# Patient Record
Sex: Male | Born: 1990 | Race: Black or African American | Hispanic: No | Marital: Single | Smoking: Never smoker
Health system: Southern US, Community
[De-identification: ages and names within clinical notes are randomized; demographics above are authoritative.]

## PROBLEM LIST (undated history)

## (undated) DIAGNOSIS — H409 Unspecified glaucoma: Secondary | ICD-10-CM

## (undated) DIAGNOSIS — R002 Palpitations: Secondary | ICD-10-CM

## (undated) DIAGNOSIS — G43909 Migraine, unspecified, not intractable, without status migrainosus: Secondary | ICD-10-CM

## (undated) HISTORY — DX: Migraine, unspecified, not intractable, without status migrainosus: G43.909

## (undated) HISTORY — PX: OTHER SURGICAL HISTORY: SHX169

## (undated) HISTORY — PX: ELBOW FRACTURE SURGERY: SHX616

## (undated) NOTE — Telephone Encounter (Signed)
 Formatting of this note is different from the original. Future Appointments  Date Time Provider Department Center  08/13/2022  4:00 PM [facility] None  07/13/2023  7:30 AM [facility] None  07/20/2023  8:15 AM Perri Lockwood, MD [facility]   Electronically signed by Annabella Charlies Hatchet, RX TECH at 07/31/2022  3:53 PM EDT

---

## 2013-03-15 ENCOUNTER — Emergency Department (HOSPITAL_COMMUNITY)
Admission: EM | Admit: 2013-03-15 | Discharge: 2013-03-15 | Disposition: A | Discharge status: Home / Self Care | Payer: BC Managed Care – PPO | Attending: Emergency Medicine | Admitting: Emergency Medicine

## 2013-03-15 ENCOUNTER — Encounter (HOSPITAL_COMMUNITY): Payer: Self-pay

## 2013-03-15 DIAGNOSIS — R51 Headache: Secondary | ICD-10-CM | POA: Insufficient documentation

## 2013-03-15 DIAGNOSIS — Z79899 Other long term (current) drug therapy: Secondary | ICD-10-CM | POA: Insufficient documentation

## 2013-03-15 DIAGNOSIS — J309 Allergic rhinitis, unspecified: Secondary | ICD-10-CM | POA: Insufficient documentation

## 2013-03-15 DIAGNOSIS — H409 Unspecified glaucoma: Secondary | ICD-10-CM | POA: Insufficient documentation

## 2013-03-15 HISTORY — DX: Unspecified glaucoma: H40.9

## 2013-03-15 MED ORDER — CETIRIZINE-PSEUDOEPHEDRINE ER 5-120 MG PO TB12: 1.0000 | ORAL_TABLET | Freq: Two times a day (BID) | ORAL | Status: DC

## 2013-03-15 MED ORDER — NAPROXEN 500 MG PO TABS
500.0000 mg | ORAL_TABLET | Freq: Once | ORAL | Status: AC
  Administered 2013-03-15: 500 mg via ORAL
  Filled 2013-03-15: qty 1

## 2013-03-15 MED ORDER — NAPROXEN 500 MG PO TABS: 500.0000 mg | ORAL_TABLET | Freq: Two times a day (BID) | ORAL | Status: DC

## 2013-03-15 MED ORDER — GUAIFENESIN ER 600 MG PO TB12: 1200.0000 mg | ORAL_TABLET | Freq: Two times a day (BID) | ORAL | Status: DC

## 2013-03-15 NOTE — ED Provider Notes (Signed)
History     CSN: [id number]  Arrival date & time 03/15/13  2119   First MD Initiated Contact with Patient 03/15/13 2155      Chief Complaint  Patient presents with  . Headache   HPI  History provided by the patient. The patient is a 22 year old male with hx of glaucoma who presents with complaints of throbbing pressure headache to his bilateral forehead. Symptoms began 20-30 minutes prior to arrival. They have been gradually worsening. Pain is described as mild to moderate. It is not the most severe headache he has ever had. Patient states he has had similar headaches more frequently for the past several months. He states he's never really had headaches like this growing up otherwise. He does suffer from seasonal allergies but reports very mild symptoms currently. He has not been taking any daily allergy medications. He denies any vision changes. No blurred vision or eye pain. No dry eyes. No other aggravating or alleviating factors. No other associated symptoms. No fever, chills or sweats. No neck pain or stiffness. No nasal congestion, rhinorrhea or sinus pressure.     Past Medical History  Diagnosis Date  . Glaucoma     History reviewed. No pertinent past surgical history.  History reviewed. No pertinent family history.  History  Substance Use Topics  . Smoking status: Not on file  . Smokeless tobacco: Not on file  . Alcohol Use: No    OB History   Grav Para Term Preterm Abortions TAB SAB Ect Mult Living                  Review of Systems  Constitutional: Negative for fever, chills and diaphoresis.  Eyes: Negative for photophobia.  Gastrointestinal: Negative for nausea and vomiting.  Neurological: Positive for headaches. Negative for dizziness, weakness, light-headedness and numbness.  All other systems reviewed and are negative.    Allergies  Review of patient's allergies indicates no known allergies.  Home Medications   Current Outpatient Rx  Name  Route   Sig  Dispense  Refill  . aspirin-acetaminophen-caffeine (EXCEDRIN MIGRAINE) 250-250-65 MG per tablet   Oral   Take 1 tablet by mouth every 6 (six) hours as needed for pain.         Marland Kitchen latanoprost (XALATAN) 0.005 % ophthalmic solution   Both Eyes   Place 1 drop into both eyes at bedtime.         . cetirizine-pseudoephedrine (ZYRTEC-D) 5-120 MG per tablet   Oral   Take 1 tablet by mouth 2 (two) times daily.   30 tablet   0   . guaiFENesin (MUCINEX) 600 MG 12 hr tablet   Oral   Take 2 tablets (1,200 mg total) by mouth 2 (two) times daily.   60 tablet   0   . naproxen (NAPROSYN) 500 MG tablet   Oral   Take 1 tablet (500 mg total) by mouth 2 (two) times daily.   30 tablet   0     BP 123/61  Pulse 100  Temp(Src) 98.1 F (36.7 C) (Oral)  Resp 22  Ht 5\' 10"  (1.778 m)  Wt 153 lb 8 oz (69.627 kg)  BMI 22.02 kg/m2  SpO2 100%  Physical Exam  Nursing note and vitals reviewed. Constitutional: He is oriented to person, place, and time. He appears well-developed and well-nourished. No distress.  HENT:  Head: Normocephalic and atraumatic.  No sinus pressure or nasal congestion  Eyes: Conjunctivae and EOM are normal. Pupils are equal,  round, and reactive to light.  Neck: Normal range of motion. Neck supple.  No meningeal sign  Cardiovascular: Normal rate and regular rhythm.   Pulmonary/Chest: Effort normal and breath sounds normal. No respiratory distress. He has no wheezes.  Musculoskeletal: Normal range of motion.  Neurological: He is alert and oriented to person, place, and time. He has normal strength. No cranial nerve deficit or sensory deficit. Coordination and gait normal.  Skin: Skin is warm. No rash noted.  Psychiatric: He has a normal mood and affect. His behavior is normal.    ED Course  Procedures     1. Headache       MDM  10:10PM patient seen and evaluated. Patient well-appearing in no acute distress. Normal nonfocal neuro exam. No concerning symptoms  for his headache.        Angus Seller, PA-C 03/16/13 [phone]

## 2013-03-15 NOTE — ED Notes (Signed)
Pt complains of a headache that started about 20 minutes ago, no hx but states that he's been having them lately.

## 2013-03-16 NOTE — ED Provider Notes (Signed)
Medical screening examination/treatment/procedure(s) were performed by non-physician practitioner and as supervising physician I was immediately available for consultation/collaboration.   Charles B. Bernette Mayers, MD 03/16/13 [phone]

## 2013-04-09 ENCOUNTER — Emergency Department (HOSPITAL_BASED_OUTPATIENT_CLINIC_OR_DEPARTMENT_OTHER): Payer: BC Managed Care – PPO

## 2013-04-09 ENCOUNTER — Emergency Department (HOSPITAL_BASED_OUTPATIENT_CLINIC_OR_DEPARTMENT_OTHER)
Admission: EM | Admit: 2013-04-09 | Discharge: 2013-04-09 | Disposition: A | Discharge status: Home / Self Care | Payer: BC Managed Care – PPO | Attending: Emergency Medicine | Admitting: Emergency Medicine

## 2013-04-09 ENCOUNTER — Other Ambulatory Visit: Payer: Self-pay

## 2013-04-09 ENCOUNTER — Encounter (HOSPITAL_BASED_OUTPATIENT_CLINIC_OR_DEPARTMENT_OTHER): Payer: Self-pay

## 2013-04-09 DIAGNOSIS — R002 Palpitations: Secondary | ICD-10-CM | POA: Insufficient documentation

## 2013-04-09 DIAGNOSIS — R197 Diarrhea, unspecified: Secondary | ICD-10-CM | POA: Insufficient documentation

## 2013-04-09 DIAGNOSIS — H409 Unspecified glaucoma: Secondary | ICD-10-CM | POA: Insufficient documentation

## 2013-04-09 DIAGNOSIS — Z79899 Other long term (current) drug therapy: Secondary | ICD-10-CM | POA: Insufficient documentation

## 2013-04-09 DIAGNOSIS — R0602 Shortness of breath: Secondary | ICD-10-CM | POA: Insufficient documentation

## 2013-04-09 LAB — CBC WITH DIFFERENTIAL/PLATELET
Eosinophils Relative: 1 % (ref 0–5)
HCT: 41.6 % (ref 39.0–52.0)
Lymphocytes Relative: 15 % (ref 12–46)
Lymphs Abs: 1.3 10*3/uL (ref 0.7–4.0)
MCV: 81.6 fL (ref 78.0–100.0)
Monocytes Absolute: 0.7 10*3/uL (ref 0.1–1.0)
RBC: 5.1 MIL/uL (ref 4.22–5.81)
RDW: 12.6 % (ref 11.5–15.5)
WBC: 8.3 10*3/uL (ref 4.0–10.5)

## 2013-04-09 LAB — BASIC METABOLIC PANEL
CO2: 28 mEq/L (ref 19–32)
Calcium: 9.5 mg/dL (ref 8.4–10.5)
Creatinine, Ser: 1 mg/dL (ref 0.50–1.35)
Glucose, Bld: 91 mg/dL (ref 70–99)

## 2013-04-09 NOTE — ED Notes (Signed)
Pt states that he feels like he his heart is racing, reports feeling tired, and also c/o sob.  Pt does not appear to be in any distress at time of triage, ambulated to triage with quick gait, in no distress.  Pulse 62bpm.  BP 103 /68

## 2013-04-09 NOTE — ED Provider Notes (Signed)
History     CSN: [id number]  Arrival date & time 04/09/13  1155   First MD Initiated Contact with Patient 04/09/13 1300      Chief Complaint  Patient presents with  . Palpitations    (Consider location/radiation/quality/duration/timing/severity/associated sxs/prior treatment) Patient is a 22 y.o. male presenting with palpitations. The history is provided by the patient. No language interpreter was used.  Palpitations Palpitations quality:  Fast Onset quality:  Sudden Progression:  Resolved Chronicity:  New Context: anxiety   Relieved by:  Nothing Ineffective treatments:  None tried Pt reports he was getting ready for work.   Pt reports he had 2 episodes of diarrhea.  Pt reports he became short of breath and his heart began racing.   (Pt thinks he may have had an anxiety attack)   Pt reports his hear feels normal and his breathing is normal now  Past Medical History  Diagnosis Date  . Glaucoma     Past Surgical History  Procedure Laterality Date  . Elbow fracture surgery      No family history on file.  History  Substance Use Topics  . Smoking status: Never Smoker   . Smokeless tobacco: Never Used  . Alcohol Use: No      Review of Systems  Cardiovascular: Positive for palpitations.  All other systems reviewed and are negative.    Allergies  Review of patient's allergies indicates no known allergies.  Home Medications   Current Outpatient Rx  Name  Route  Sig  Dispense  Refill  . aspirin-acetaminophen-caffeine (EXCEDRIN MIGRAINE) 250-250-65 MG per tablet   Oral   Take 1 tablet by mouth every 6 (six) hours as needed for pain.         . cetirizine-pseudoephedrine (ZYRTEC-D) 5-120 MG per tablet   Oral   Take 1 tablet by mouth 2 (two) times daily.   30 tablet   0   . guaiFENesin (MUCINEX) 600 MG 12 hr tablet   Oral   Take 2 tablets (1,200 mg total) by mouth 2 (two) times daily.   60 tablet   0   . latanoprost (XALATAN) 0.005 % ophthalmic  solution   Both Eyes   Place 1 drop into both eyes at bedtime.         . naproxen (NAPROSYN) 500 MG tablet   Oral   Take 1 tablet (500 mg total) by mouth 2 (two) times daily.   30 tablet   0     BP 103/68  Pulse 62  Temp(Src) 98.8 F (37.1 C) (Oral)  Resp 16  Ht 5\' 10"  (1.778 m)  Wt 150 lb (68.04 kg)  BMI 21.52 kg/m2  SpO2 100%  Physical Exam  Nursing note and vitals reviewed. Constitutional: He is oriented to person, place, and time. He appears well-developed and well-nourished.  HENT:  Head: Normocephalic.  Eyes: Pupils are equal, round, and reactive to light.  Neck: Normal range of motion.  Cardiovascular: Normal rate and regular rhythm.   Pulmonary/Chest: Effort normal and breath sounds normal.  Abdominal: Soft. Bowel sounds are normal.  Neurological: He is alert and oriented to person, place, and time. He has normal reflexes.  Skin: Skin is warm.  Psychiatric: He has a normal mood and affect.    ED Course  Procedures (including critical care time)  Labs Reviewed  CBC WITH DIFFERENTIAL  BASIC METABOLIC PANEL   Dg Chest 2 View  04/09/2013   *RADIOLOGY REPORT*  Clinical Data:  Palpitations and fatigue.  CHEST - 2 VIEW  Comparison: None  Findings: The heart size and mediastinal contours are within normal limits.  Both lungs are clear.  The visualized skeletal structures are unremarkable.  IMPRESSION: No active disease.   Original Report Authenticated By: Irish Lack, M.D.     No diagnosis found.    MDM   Date: 04/09/2013  Rate: 59  Rhythm: sinus bradycardia  QRS Axis: normal  Intervals: normal  ST/T Wave abnormalities: normal  Conduction Disutrbances:none  Narrative Interpretation:   Old EKG Reviewed: none available   Pt monitored  Normal.   Labs normal.    Pt may have viral illness.   Pt given phone numbers for primary care MD       Elson Areas, PA-C 04/09/13 1528

## 2013-04-10 NOTE — ED Provider Notes (Signed)
Medical screening examination/treatment/procedure(s) were performed by non-physician practitioner and as supervising physician I was immediately available for consultation/collaboration.   Joseph Lin. Oletta Lamas, MD 04/10/13 [phone]

## 2013-04-17 ENCOUNTER — Emergency Department (HOSPITAL_BASED_OUTPATIENT_CLINIC_OR_DEPARTMENT_OTHER)
Admission: EM | Admit: 2013-04-17 | Discharge: 2013-04-17 | Disposition: A | Discharge status: Home / Self Care | Payer: BC Managed Care – PPO | Attending: Emergency Medicine | Admitting: Emergency Medicine

## 2013-04-17 ENCOUNTER — Encounter (HOSPITAL_BASED_OUTPATIENT_CLINIC_OR_DEPARTMENT_OTHER): Payer: Self-pay | Admitting: *Deleted

## 2013-04-17 DIAGNOSIS — Z8669 Personal history of other diseases of the nervous system and sense organs: Secondary | ICD-10-CM | POA: Insufficient documentation

## 2013-04-17 DIAGNOSIS — K529 Noninfective gastroenteritis and colitis, unspecified: Secondary | ICD-10-CM

## 2013-04-17 DIAGNOSIS — Z79899 Other long term (current) drug therapy: Secondary | ICD-10-CM | POA: Insufficient documentation

## 2013-04-17 DIAGNOSIS — K5289 Other specified noninfective gastroenteritis and colitis: Secondary | ICD-10-CM | POA: Insufficient documentation

## 2013-04-17 LAB — URINALYSIS, ROUTINE W REFLEX MICROSCOPIC
Glucose, UA: NEGATIVE mg/dL
Hgb urine dipstick: NEGATIVE
Ketones, ur: NEGATIVE mg/dL
Protein, ur: 30 mg/dL — AB

## 2013-04-17 LAB — CBC WITH DIFFERENTIAL/PLATELET
Basophils Absolute: 0 10*3/uL (ref 0.0–0.1)
Basophils Relative: 0 % (ref 0–1)
Eosinophils Relative: 0 % (ref 0–5)
HCT: 45.4 % (ref 39.0–52.0)
Lymphocytes Relative: 5 % — ABNORMAL LOW (ref 12–46)
MCHC: 35.7 g/dL (ref 30.0–36.0)
MCV: 80.9 fL (ref 78.0–100.0)
Monocytes Absolute: 0.5 10*3/uL (ref 0.1–1.0)
RDW: 12.7 % (ref 11.5–15.5)

## 2013-04-17 LAB — URINE MICROSCOPIC-ADD ON

## 2013-04-17 LAB — BASIC METABOLIC PANEL
CO2: 27 mEq/L (ref 19–32)
Calcium: 9.6 mg/dL (ref 8.4–10.5)
Creatinine, Ser: 1 mg/dL (ref 0.50–1.35)

## 2013-04-17 MED ORDER — SODIUM CHLORIDE 0.9 % IV BOLUS (SEPSIS)
1000.0000 mL | Freq: Once | INTRAVENOUS | Status: AC
  Administered 2013-04-17: 1000 mL via INTRAVENOUS

## 2013-04-17 MED ORDER — ONDANSETRON HCL 4 MG/2ML IJ SOLN
4.0000 mg | Freq: Once | INTRAMUSCULAR | Status: AC
  Administered 2013-04-17: 4 mg via INTRAVENOUS
  Filled 2013-04-17: qty 2

## 2013-04-17 MED ORDER — ONDANSETRON HCL 8 MG PO TABS: 8.0000 mg | ORAL_TABLET | Freq: Two times a day (BID) | ORAL | Status: DC | PRN

## 2013-04-17 MED ORDER — KETOROLAC TROMETHAMINE 30 MG/ML IJ SOLN
30.0000 mg | Freq: Once | INTRAMUSCULAR | Status: AC
  Administered 2013-04-17: 30 mg via INTRAVENOUS
  Filled 2013-04-17: qty 1

## 2013-04-17 NOTE — ED Provider Notes (Signed)
History    CSN: [id number] Arrival date & time 04/17/13  0918  First MD Initiated Contact with Patient 04/17/13 [phone]     Chief Complaint  Patient presents with  . Emesis   (Consider location/radiation/quality/duration/timing/severity/associated sxs/prior Treatment) Patient is a 22 y.o. male presenting with vomiting. The history is provided by the patient.  Emesis Severity:  Moderate Duration:  12 hours Timing:  Constant Quality:  Stomach contents Progression:  Worsening Chronicity:  New Recent urination:  Decreased Relieved by:  Nothing Worsened by:  Nothing tried Ineffective treatments:  None tried Associated symptoms: no abdominal pain, no chills, no diarrhea and no fever    Past Medical History  Diagnosis Date  . Glaucoma    Past Surgical History  Procedure Laterality Date  . Elbow fracture surgery     History reviewed. No pertinent family history. History  Substance Use Topics  . Smoking status: Never Smoker   . Smokeless tobacco: Never Used  . Alcohol Use: No    Review of Systems  Constitutional: Negative for chills.  Gastrointestinal: Positive for vomiting. Negative for abdominal pain and diarrhea.  All other systems reviewed and are negative.    Allergies  Review of patient's allergies indicates no known allergies.  Home Medications   Current Outpatient Rx  Name  Route  Sig  Dispense  Refill  . aspirin-acetaminophen-caffeine (EXCEDRIN MIGRAINE) 250-250-65 MG per tablet   Oral   Take 1 tablet by mouth every 6 (six) hours as needed for pain.         . cetirizine-pseudoephedrine (ZYRTEC-D) 5-120 MG per tablet   Oral   Take 1 tablet by mouth 2 (two) times daily.   30 tablet   0   . guaiFENesin (MUCINEX) 600 MG 12 hr tablet   Oral   Take 2 tablets (1,200 mg total) by mouth 2 (two) times daily.   60 tablet   0   . latanoprost (XALATAN) 0.005 % ophthalmic solution   Both Eyes   Place 1 drop into both eyes at bedtime.         . naproxen  (NAPROSYN) 500 MG tablet   Oral   Take 1 tablet (500 mg total) by mouth 2 (two) times daily.   30 tablet   0    BP 113/62  Pulse 90  Temp(Src) 98.3 F (36.8 C) (Oral)  Resp 16  Ht 5\' 10"  (1.778 m)  Wt 149 lb (67.586 kg)  BMI 21.38 kg/m2  SpO2 100% Physical Exam  Nursing note and vitals reviewed. Constitutional: He is oriented to person, place, and time. He appears well-developed and well-nourished. No distress.  HENT:  Head: Normocephalic and atraumatic.  Mouth/Throat: Oropharynx is clear and moist.  Neck: Normal range of motion. Neck supple.  Cardiovascular: Normal rate and regular rhythm.   No murmur heard. Pulmonary/Chest: Effort normal and breath sounds normal. No respiratory distress. He has no wheezes.  Abdominal: Soft. Bowel sounds are normal. He exhibits no distension. There is no tenderness.  Musculoskeletal: Normal range of motion.  Neurological: He is alert and oriented to person, place, and time.  Skin: Skin is warm and dry. He is not diaphoretic.    ED Course  Procedures (including critical care time) Labs Reviewed  URINALYSIS, ROUTINE W REFLEX MICROSCOPIC   No results found. No diagnosis found.  MDM  The presentation, exam, and workup are consistent with a viral gastroenteritis.  He was given ivf, meds, and is feeling better.  Will discharge to home with  zofran, return prn.  Geoffery Lyons, MD 04/17/13 1045

## 2013-04-17 NOTE — ED Notes (Signed)
Pt states he woke up in middle of night and vomited has vomited 3 times total since then as well as 2 episodes of loose stool. No fever or chills

## 2013-06-08 ENCOUNTER — Emergency Department (HOSPITAL_BASED_OUTPATIENT_CLINIC_OR_DEPARTMENT_OTHER)
Admission: EM | Admit: 2013-06-08 | Discharge: 2013-06-08 | Disposition: A | Discharge status: Home / Self Care | Payer: BC Managed Care – PPO | Attending: Emergency Medicine | Admitting: Emergency Medicine

## 2013-06-08 ENCOUNTER — Encounter (HOSPITAL_BASED_OUTPATIENT_CLINIC_OR_DEPARTMENT_OTHER): Payer: Self-pay | Admitting: Emergency Medicine

## 2013-06-08 DIAGNOSIS — Z87828 Personal history of other (healed) physical injury and trauma: Secondary | ICD-10-CM | POA: Insufficient documentation

## 2013-06-08 DIAGNOSIS — Z791 Long term (current) use of non-steroidal anti-inflammatories (NSAID): Secondary | ICD-10-CM | POA: Insufficient documentation

## 2013-06-08 DIAGNOSIS — R220 Localized swelling, mass and lump, head: Secondary | ICD-10-CM | POA: Insufficient documentation

## 2013-06-08 DIAGNOSIS — Z79899 Other long term (current) drug therapy: Secondary | ICD-10-CM | POA: Insufficient documentation

## 2013-06-08 DIAGNOSIS — Z8669 Personal history of other diseases of the nervous system and sense organs: Secondary | ICD-10-CM | POA: Insufficient documentation
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
## 2013-06-08 NOTE — ED Notes (Signed)
MD at bedside. 

## 2013-07-07 ENCOUNTER — Encounter: Payer: Self-pay | Admitting: Neurology

## 2013-07-07 ENCOUNTER — Ambulatory Visit (INDEPENDENT_AMBULATORY_CARE_PROVIDER_SITE_OTHER): Payer: BC Managed Care – PPO | Admitting: Neurology

## 2013-07-07 VITALS — BP 108/68 | HR 61 | Ht 68.0 in | Wt 152.0 lb

## 2013-07-07 DIAGNOSIS — H409 Unspecified glaucoma: Secondary | ICD-10-CM | POA: Insufficient documentation

## 2013-07-07 DIAGNOSIS — G43009 Migraine without aura, not intractable, without status migrainosus: Secondary | ICD-10-CM

## 2013-07-07 NOTE — Patient Instructions (Addendum)
Overall you are doing fairly well but I do want to suggest a few things today:   Remember to drink plenty of fluid, eat healthy meals and do not skip any meals. Try to eat protein with a every meal and eat a healthy snack such as fruit or nuts in between meals. Try to keep a regular sleep-wake schedule and try to exercise daily, particularly in the form of walking, 20-30 minutes a day, if you can.   As far as your medications are concerned, we discussed starting different agents but will hold off as it appears your symptoms are improving  I would like to see you back in 6 months, sooner if we need to. Please call us with any interim questions, concerns, problems, updates or refill requests.    My clinical assistant and will answer any of your questions and relay your messages to me and also relay most of my messages to you.   Our phone number is [phone]. We also have an after hours call service for urgent matters and there is a physician on-call for urgent questions. For any emergencies you know to call 911 or go to the nearest emergency room

## 2013-07-07 NOTE — Progress Notes (Signed)
[facility]  Provider:  Dr Hosie Poisson Referring Provider: No ref. provider found Primary Care Physician:  Pcp Not In System  CC:  headache  HPI:  Joseph Lin is a 22 y.o. male here as a referral from the ED for headache evaluation.   Started having headaches a  Few months ago, getting 4 a week or so. Last hours to all day. Mainly right sided, throbbing, pulsating. No N/V. No photo and phonophobia. Had some numbness on R side of scalp. No vertigo. Some blurry vision with headache. No aura. Has history of migraines in the past, they were similar to this. Takes Excedrin and tylenol for the headache. Takes extra strength tylenol around 5 times a week. Goes in quiet dark room. At worst headaches are a 5/10. Feels headache frequency and severity have been decreasing week or so  Getting 7 to 8 sleep a night. Currently in school, notes having a lot of stress. Limited caffeine intake. No triggers. Random times throughout the days.   Strong family hx of migraines.   Has glaucoma, recently seen eye doctor, told everything is good but scheduled to see an eye specialist today.   Review of Systems: Out of a complete 14 system review, the patient complains of only the following symptoms, and all other reviewed systems are negative. Positive for headache, fatigue  History   Social History  . Marital Status: Single    Spouse Name: N/A    Number of Children: N/A  . Years of Education: N/A   Occupational History  . Not on file.   Social History Main Topics  . Smoking status: Never Smoker   . Smokeless tobacco: Never Used  . Alcohol Use: No  . Drug Use: No  . Sexual Activity: Not on file   Other Topics Concern  . Not on file   Social History Narrative  . No narrative on file    No family history on file.  Past Medical History  Diagnosis Date  . Glaucoma     Past Surgical History  Procedure Laterality Date  . Elbow fracture surgery      Current Outpatient  Prescriptions  Medication Sig Dispense Refill  . aspirin-acetaminophen-caffeine (EXCEDRIN MIGRAINE) 250-250-65 MG per tablet Take 1 tablet by mouth every 6 (six) hours as needed for pain.      . cetirizine-pseudoephedrine (ZYRTEC-D) 5-120 MG per tablet Take 1 tablet by mouth 2 (two) times daily.  30 tablet  0  . guaiFENesin (MUCINEX) 600 MG 12 hr tablet Take 2 tablets (1,200 mg total) by mouth 2 (two) times daily.  60 tablet  0  . latanoprost (XALATAN) 0.005 % ophthalmic solution Place 1 drop into both eyes at bedtime.      . naproxen (NAPROSYN) 500 MG tablet Take 1 tablet (500 mg total) by mouth 2 (two) times daily.  30 tablet  0  . ondansetron (ZOFRAN) 8 MG tablet Take 1 tablet (8 mg total) by mouth every 12 (twelve) hours as needed for nausea.  4 tablet  0   No current facility-administered medications for this visit.    Allergies as of 07/07/2013  . (No Known Allergies)    Vitals: There were no vitals taken for this visit. Last Weight:  Wt Readings from Last 1 Encounters:  04/17/13 149 lb (67.586 kg) (40%*, Z = -0.26)   * Growth percentiles are based on CDC 2-20 Years data.   Last Height:   Ht Readings from Last 1 Encounters:  04/17/13 5\' 10"  (  1.778 m) (55%*, Z = 0.14)   * Growth percentiles are based on CDC 2-20 Years data.     Physical exam: Exam: Gen: NAD, conversant Eyes: anicteric sclerae, moist conjunctivae HENT: Atraumati Neck: Trachea midline; supple,  Lungs: CTA, no wheezing, rales, rhonic                          CV: RRR, no MRG Abdomen: Soft, non-tender;  Extremities: No peripheral edema  Skin: Normal temperature, no rash,  Psych: Appropriate affect, pleasant  Neuro: MS: AA&Ox3, appropriately interactive, normal affect   Speech: fluent w/o paraphasic error  Memory: good recent and remote recall  CN: PERRL, EOMI no nystagmus, funduscopic exam within normal limits, visual fields full to finger count bilaterally, no ptosis, sensation intact to LT V1-V3  bilat, face symmetric, no weakness, hearing grossly intact, palate elevates symmetrically, shoulder shrug 5/5 bilat,  tongue protrudes midline, no fasiculations noted.  Motor: normal bulk and tone Strength: 5/5  In all extremities  Coord: rapid alternating and point-to-point (FNF, HTS) movements intact.  Reflexes: symmetrical, bilat downgoing toes  Sens: LT intact in all extremities  Gait: posture, stance, stride and arm-swing normal. Tandem gait intact. Able to walk on heels and toes. Romberg absent.   Assessment:  After physical and neurologic examination, review of laboratory studies, imaging, neurophysiology testing and pre-existing records, assessment will be reviewed on the problem list.  Plan:  Treatment plan and additional workup will be reviewed under Problem List.  1)Migraine without aura 2)glaucoma  Mr Joseph Lin is a pleasant 22 year old gentleman who presents for initial evaluation of headache. He has a history of migraine headaches and presents with increased frequency of headaches in the past few months. Though he notes that the frequency and severity have been decreasing in the past few weeks. Headaches are typically right-sided, described as pulsating pounding with severity a 5/10 at its worst. He does have a history of glaucoma for which she is followed by the eye physician, reports recently been evaluated and told everything looks good, but will be followed with eye specialist this week. Physical exam is unremarkable. Suspect his headaches are likely migraine without aura. Though would be concerned with his history of glaucoma. He will followup with the eye doctor. We discussed different therapeutic options, as this headache frequency seems to be decreasing will hold off on starting prophylactic medication at this time. He'll continue to use Tylenol on a limited as needed basis. If headache frequency increases will consider starting prophylactic agent. Followup in 6 months

## 2013-09-03 ENCOUNTER — Emergency Department (HOSPITAL_BASED_OUTPATIENT_CLINIC_OR_DEPARTMENT_OTHER)
Admission: EM | Admit: 2013-09-03 | Discharge: 2013-09-03 | Disposition: A | Discharge status: Home / Self Care | Payer: BC Managed Care – PPO | Attending: Emergency Medicine | Admitting: Emergency Medicine

## 2013-09-03 ENCOUNTER — Encounter (HOSPITAL_BASED_OUTPATIENT_CLINIC_OR_DEPARTMENT_OTHER): Payer: Self-pay | Admitting: Emergency Medicine

## 2013-09-03 DIAGNOSIS — I498 Other specified cardiac arrhythmias: Secondary | ICD-10-CM | POA: Insufficient documentation

## 2013-09-03 DIAGNOSIS — R002 Palpitations: Secondary | ICD-10-CM | POA: Insufficient documentation

## 2013-09-03 DIAGNOSIS — R001 Bradycardia, unspecified: Secondary | ICD-10-CM

## 2013-09-03 DIAGNOSIS — G43909 Migraine, unspecified, not intractable, without status migrainosus: Secondary | ICD-10-CM | POA: Insufficient documentation

## 2013-09-03 DIAGNOSIS — IMO0002 Reserved for concepts with insufficient information to code with codable children: Secondary | ICD-10-CM | POA: Insufficient documentation

## 2013-09-03 DIAGNOSIS — Z8669 Personal history of other diseases of the nervous system and sense organs: Secondary | ICD-10-CM | POA: Insufficient documentation

## 2013-09-03 NOTE — ED Provider Notes (Signed)
CSN: [id number]     Arrival date & time 09/03/13  4098 History   First MD Initiated Contact with Patient 09/03/13 0357     Chief Complaint  Patient presents with  . Palpitations   (Consider location/radiation/quality/duration/timing/severity/associated sxs/prior Treatment) HPI This is a 22 year old male who reports episodes of palpitations that began yesterday evening about 5 PM after he took an Excedrin. He has taken Excedrin in the past without incident. He describes the palpitations as feeling like I said in his chest followed by a rapid heartbeat. This has occurred several times since yesterday afternoon and each episode lasts less than a minute. He denies chest pain, shortness of breath or lightheadedness. There are no known mitigating or exacerbating factors.  Past Medical History  Diagnosis Date  . Glaucoma   . Migraine    Past Surgical History  Procedure Laterality Date  . Elbow fracture surgery    .  head surgery     Family History  Problem Relation Age of Onset  . Migraines Mother   . Prostate cancer Father   . Migraines Father   . Diabetes Sister    History  Substance Use Topics  . Smoking status: Never Smoker   . Smokeless tobacco: Never Used  . Alcohol Use: No    Review of Systems  All other systems reviewed and are negative.    Allergies  Review of patient's allergies indicates no known allergies.  Home Medications   Current Outpatient Rx  Name  Route  Sig  Dispense  Refill  . aspirin-acetaminophen-caffeine (EXCEDRIN MIGRAINE) 250-250-65 MG per tablet   Oral   Take 1 tablet by mouth every 6 (six) hours as needed for pain.         Marland Kitchen latanoprost (XALATAN) 0.005 % ophthalmic solution   Both Eyes   Place 1 drop into both eyes at bedtime.         Marland Kitchen PROAIR HFA 108 (90 BASE) MCG/ACT inhaler   Inhalation   Inhale 108 puffs into the lungs as needed.         . predniSONE (STERAPRED UNI-PAK) 10 MG tablet   Oral   Take 10 mg by mouth daily.        . SUMAtriptan (IMITREX) 25 MG tablet   Oral   Take 25 mg by mouth daily as needed.          BP 131/65  Pulse 58  Temp(Src) 98.6 F (37 C) (Oral)  Resp 16  Ht 5\' 8"  (1.727 m)  Wt 150 lb (68.04 kg)  BMI 22.81 kg/m2  SpO2 100%  Physical Exam General: Well-developed, well-nourished male in no acute distress; appearance consistent with age of record HENT: normocephalic; atraumatic; conjunctival injection bilaterally Eyes: pupils equal, round and reactive to light; extraocular muscles intact Neck: supple Heart: Bradycardia with sinus arrhythmia; no murmurs, rubs or gallops Lungs: clear to auscultation bilaterally Abdomen: soft; nondistended; nontender; no masses or hepatosplenomegaly; bowel sounds present Extremities: No deformity; full range of motion; pulses normal; no edema Neurologic: Awake, alert and oriented; motor function intact in all extremities and symmetric; no facial droop Skin: Warm and dry Psychiatric: Flat affect; poor eye contact; poverty of speech    ED Course  Procedures (including critical care time)   EKG Interpretation     Ventricular Rate:  48 PR Interval:  144 QRS Duration: 96 QT Interval:  400 QTC Calculation: 357 R Axis:   90 Text Interpretation:  Sinus bradycardia Borderline  Rightward axis Borderline ECG No  significant change was found except rate is slower      MDM   4:45 AM No ectopy or arrhythmia other than sinus arrhythmia noted during ED stay. The patient's description of his palpitations are suspicious for PVCs or escape beats. Referral to cardiology for further evaluation.  Hanley Seamen, MD 09/03/13 [phone]

## 2013-09-03 NOTE — ED Notes (Signed)
Pt reports intermittent periods of chest palpitations with pressure that began around midnight while he was laying down. States that pressure and palpitations are not present at this time.

## 2013-09-04 ENCOUNTER — Encounter (HOSPITAL_BASED_OUTPATIENT_CLINIC_OR_DEPARTMENT_OTHER): Payer: Self-pay | Admitting: Emergency Medicine

## 2013-09-04 ENCOUNTER — Emergency Department (HOSPITAL_BASED_OUTPATIENT_CLINIC_OR_DEPARTMENT_OTHER)
Admission: EM | Admit: 2013-09-04 | Discharge: 2013-09-04 | Disposition: A | Discharge status: Home / Self Care | Payer: BC Managed Care – PPO | Attending: Emergency Medicine | Admitting: Emergency Medicine

## 2013-09-04 DIAGNOSIS — I951 Orthostatic hypotension: Secondary | ICD-10-CM | POA: Insufficient documentation

## 2013-09-04 DIAGNOSIS — IMO0002 Reserved for concepts with insufficient information to code with codable children: Secondary | ICD-10-CM | POA: Insufficient documentation

## 2013-09-04 DIAGNOSIS — H409 Unspecified glaucoma: Secondary | ICD-10-CM | POA: Insufficient documentation

## 2013-09-04 DIAGNOSIS — Z79899 Other long term (current) drug therapy: Secondary | ICD-10-CM | POA: Insufficient documentation

## 2013-09-04 DIAGNOSIS — R002 Palpitations: Secondary | ICD-10-CM | POA: Insufficient documentation

## 2013-09-04 DIAGNOSIS — R42 Dizziness and giddiness: Secondary | ICD-10-CM | POA: Insufficient documentation

## 2013-09-04 DIAGNOSIS — G43909 Migraine, unspecified, not intractable, without status migrainosus: Secondary | ICD-10-CM | POA: Insufficient documentation

## 2013-09-04 HISTORY — DX: Palpitations: R00.2

## 2013-09-04 LAB — CBC
HCT: 41.9 % (ref 39.0–52.0)
Hemoglobin: 14.3 g/dL (ref 13.0–17.0)
MCH: 28 pg (ref 26.0–34.0)
MCHC: 34.1 g/dL (ref 30.0–36.0)
MCV: 82.2 fL (ref 78.0–100.0)
Platelets: 268 10*3/uL (ref 150–400)
RBC: 5.1 MIL/uL (ref 4.22–5.81)
RDW: 12.7 % (ref 11.5–15.5)
WBC: 6.8 10*3/uL (ref 4.0–10.5)

## 2013-09-04 LAB — BASIC METABOLIC PANEL
BUN: 18 mg/dL (ref 6–23)
CO2: 27 mEq/L (ref 19–32)
Calcium: 9.3 mg/dL (ref 8.4–10.5)
Chloride: 101 mEq/L (ref 96–112)
Creatinine, Ser: 1.1 mg/dL (ref 0.50–1.35)
GFR calc Af Amer: >90 mL/min (ref >90)
GFR calc non Af Amer: >90 mL/min (ref >90)
Glucose, Bld: 92 mg/dL (ref 70–99)
Potassium: 4 mEq/L (ref 3.5–5.1)
Sodium: 138 mEq/L (ref 135–145)

## 2013-09-04 MED ORDER — SODIUM CHLORIDE 0.9 % IV BOLUS (SEPSIS)
1000.0000 mL | INTRAVENOUS | Status: AC
  Administered 2013-09-04: 1000 mL via INTRAVENOUS

## 2013-09-04 NOTE — ED Provider Notes (Signed)
Medical screening examination/treatment/procedure(s) were performed by non-physician practitioner and as supervising physician I was immediately available for consultation/collaboration.  EKG Interpretation   None         Notnamed Scholz, MD 09/04/13 2330 

## 2013-09-04 NOTE — ED Notes (Signed)
Pt reports palpitations throughout today- seen here last night and dx with same- states also feels dizzy

## 2013-09-04 NOTE — ED Notes (Signed)
PA at bedside.

## 2013-09-04 NOTE — ED Provider Notes (Signed)
CSN: [id number]     Arrival date & time 09/04/13  1954 History   First MD Initiated Contact with Patient 09/04/13 2027     Chief Complaint  Patient presents with  . 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  Pt was seen yesterday in ED for same, advised to refrain from caffeine consumption and f/u with Cardiology.  Pt reports he was concerned he had palpitations today w/o consumption of caffeine.  States he was "just standing there" at work when palpitations started associated with some dizziness. Reports palpitations are worse with movement.  Described as heart racing but no chest pain or SOB. Denies diaphoresis, abdominal pain, nausea or vomiting. Denies use of etoh or other drugs. Denies family hx of CAD or sudden death.  Cardiology appointment scheduled for December.  Past Medical History  Diagnosis Date  . Glaucoma   . Migraine   . Heart palpitations    Past Surgical History  Procedure Laterality Date  . Elbow fracture surgery    .  head surgery     Family History  Problem Relation Age of Onset  . Migraines Mother   . Prostate cancer Father   . Migraines Father   . Diabetes Sister    History  Substance Use Topics  . Smoking status: Never Smoker   . Smokeless tobacco: Never Used  . Alcohol Use: No    Review of Systems  Constitutional: Negative for fever, chills, diaphoresis and fatigue.  Respiratory: Negative for cough and shortness of breath.   Cardiovascular: Positive for palpitations. Negative for chest pain and leg swelling.  Gastrointestinal: Negative for nausea and vomiting.  All other systems reviewed and are negative.    Allergies  Review of patient's allergies indicates no known allergies.  Home Medications   Current Outpatient Rx  Name  Route  Sig  Dispense  Refill   . aspirin-acetaminophen-caffeine (EXCEDRIN MIGRAINE) 250-250-65 MG per tablet   Oral   Take 1 tablet by mouth every 6 (six) hours as needed for pain.         Marland Kitchen latanoprost (XALATAN) 0.005 % ophthalmic solution   Both Eyes   Place 1 drop into both eyes at bedtime.         Marland Kitchen PROAIR HFA 108 (90 BASE) MCG/ACT inhaler   Inhalation   Inhale 108 puffs into the lungs as needed.         . SUMAtriptan (IMITREX) 25 MG tablet   Oral   Take 25 mg by mouth daily as needed.         . predniSONE (STERAPRED UNI-PAK) 10 MG tablet   Oral   Take 10 mg by mouth daily.          BP 113/72  Pulse 74  Temp(Src) 97.8 F (36.6 C) (Oral)  Resp 16  Ht 5\' 8"  (1.727 m)  Wt 150 lb (68.04 kg)  BMI 22.81 kg/m2  SpO2 100% Physical Exam  Nursing note and vitals reviewed. Constitutional: He appears well-developed and well-nourished.  Pt lying comfortably in exam bed, NAD.   HENT:  Head: Normocephalic and atraumatic.  Eyes: Conjunctivae are normal. No scleral icterus.  Neck: Normal range of motion.  Cardiovascular: Normal rate, regular rhythm and normal heart sounds.   Pulmonary/Chest: Effort normal and breath sounds normal. No respiratory distress. He has no wheezes. He has no rales. He exhibits no  tenderness.  No respiratory distress, able to speak in full sentences w/o difficulty. Lungs: CTAB. No chest wall tenderness.  Abdominal: Soft. Bowel sounds are normal. He exhibits no distension and no mass. There is no tenderness. There is no rebound and no guarding.  Musculoskeletal: Normal range of motion.  Neurological: He is alert.  Skin: Skin is warm and dry.    ED Course  Procedures (including critical care time) Labs Review Labs Reviewed  CBC  BASIC METABOLIC PANEL   Imaging Review No results found.  EKG Interpretation   None       MDM   1. Palpitations   2. Orthostatic hypotension    Pt c/o palpitations. Was seen yesterday for same. Denies chest pain or SOB. Denies use of  caffeine today when palpitations started.  EKG: unremarkable. Not concerning for ACS.  Vitals: unremarkable. Will get orthostatic vital signs.  If normal will discharge home.   Pt was found to have orthostatic hypotension.  1L fluids given in ED and CBC and BMP drawn.  Labs: unremarkable. Orthostatic vitals and pt's symptoms improved after fluids given.  Strongly advised pt to drink plenty of water per day and eat at least 3 full meals per day.  Also advised to get 8hrs of sleep.  F/u with cardiologist as scheduled in December. Return precautions provided. Pt verbalized understanding and agreement with tx plan.   Junius Finner, PA-C 09/04/13 2307

## 2013-09-06 ENCOUNTER — Encounter: Payer: Self-pay | Admitting: Cardiology

## 2013-09-06 ENCOUNTER — Ambulatory Visit (INDEPENDENT_AMBULATORY_CARE_PROVIDER_SITE_OTHER): Payer: BC Managed Care – PPO | Admitting: Cardiology

## 2013-09-06 VITALS — BP 110/74 | HR 53 | Ht 68.0 in | Wt 151.9 lb

## 2013-09-06 DIAGNOSIS — I498 Other specified cardiac arrhythmias: Secondary | ICD-10-CM

## 2013-09-06 DIAGNOSIS — R002 Palpitations: Secondary | ICD-10-CM

## 2013-09-06 DIAGNOSIS — R001 Bradycardia, unspecified: Secondary | ICD-10-CM

## 2013-09-06 NOTE — Progress Notes (Signed)
HPI The patient has no prior cardiac history. He has been in the emergency room a few times this year and I have reviewed these records. Most recently earlier this month he was having palpitations. He said this started a few weeks ago. He says if he is doing something such as lifting boxes his heart start to go fast. [place] had panic attacks.  He has never been treated for this. He does report increased stress and anxiety since June. He says when his symptoms started he feels his heart beating faster. He doesn't think that it happens suddenly. It might be a more gradual onset. It seems to be regular though he has some rare skipping beats. It lasts for about 5 minutes. He may have some sharp chest discomfort under his left chest but no neck or arm discomfort. He does get slightly short of breath and hot. He hasn't had diaphoresis. He hasn't had presyncope or syncope. It slowly goes away on its own. He has not had one he's been resting. He denies any new shortness of breath, PND or orthopnea. He does report that his job is very physical. He is also apparently going to school.  No Known Allergies  Current Outpatient Prescriptions  Medication Sig Dispense Refill  . aspirin-acetaminophen-caffeine (EXCEDRIN MIGRAINE) 250-250-65 MG per tablet Take 1 tablet by mouth every 6 (six) hours as needed for pain.      Marland Kitchen latanoprost (XALATAN) 0.005 % ophthalmic solution Place 1 drop into both eyes at bedtime.      Marland Kitchen PROAIR HFA 108 (90 BASE) MCG/ACT inhaler Inhale 108 puffs into the lungs as needed.       No current facility-administered medications for this visit.    Past Medical History  Diagnosis Date  . Glaucoma   . Migraine   . Heart palpitations     Past Surgical History  Procedure Laterality Date  . Elbow fracture surgery    .  head surgery      Family History  Problem Relation Age of Onset  . Migraines Mother   .  Prostate cancer Father   . Migraines Father   . Diabetes Sister     History   Social History  . Marital Status: Single    Spouse Name: N/A    Number of Children: 0  . Years of Education: college   Occupational History  . [employer]    Social History Main Topics  . Smoking status: Never Smoker   . Smokeless tobacco: Never Used  . Alcohol Use: No  . Drug Use: No  . Sexual Activity: Not on file   Other Topics Concern  . Not on file   Social History Narrative  . No narrative on file    ROS:  Positive for occasional dizziness, tinnitus, urinary frequency, seasonal allergies. Otherwise as stated in the HPI and negative for all other systems.   PHYSICAL EXAM BP 110/74  Pulse 53  Ht 5\' 8"  (1.727 m)  Wt 151 lb 14.4 oz (68.901 kg)  BMI 23.10 kg/m2 GENERAL:  Well appearing HEENT:  Pupils equal round and reactive, fundi not visualized, oral mucosa unremarkable NECK:  No jugular venous distention, waveform within normal limits, carotid upstroke brisk and symmetric, no bruits, no thyromegaly LYMPHATICS:  No cervical, inguinal adenopathy LUNGS:  Clear to auscultation bilaterally BACK:  No CVA tenderness CHEST:  Unremarkable HEART:  PMI not displaced or sustained,S1  and S2 within normal limits, no S3, no S4, no clicks, no rubs, no murmurs ABD:  Flat, positive bowel sounds normal in frequency in pitch, no bruits, no rebound, no guarding, no midline pulsatile mass, no hepatomegaly, no splenomegaly EXT:  2 plus pulses throughout, no edema, no cyanosis no clubbing SKIN:  No rashes no nodules NEURO:  Cranial nerves II through XII grossly intact, motor grossly intact throughout PSYCH:  Cognitively intact, oriented to person place and time   EKG:  Sinus bradycardia, rate 53, axis within normal limits, sinus arrhythmia, intervals within normal limits, no acute ST-T wave changes. 09/06/2013   ASSESSMENT AND PLAN  PALPITATIONS:  I suspect this might be panic or anxiety. However, I need to  exclude a primary dysrhythmia. I will place a 48 hour Holter monitor. Of note he reports recent lab work to include a thyroid. I did review the ER notes and other labwork which was normal.

## 2013-09-06 NOTE — Patient Instructions (Signed)
Continue current medications as listed.  Your physician has recommended that you wear a holter monitor for 48 hours. Holter monitors are medical devices that record the heart's electrical activity. Doctors most often use these monitors to diagnose arrhythmias. Arrhythmias are problems with the speed or rhythm of the heartbeat. The monitor is a small, portable device. You can wear one while you do your normal daily activities. This is usually used to diagnose what is causing palpitations/syncope (passing out).  You will be called with your results.  Follow up with Dr Antoine Poche as needed.

## 2013-09-08 ENCOUNTER — Encounter: Payer: BC Managed Care – PPO | Admitting: *Deleted

## 2013-09-14 ENCOUNTER — Encounter: Payer: Self-pay | Admitting: Cardiology

## 2013-10-10 ENCOUNTER — Encounter: Payer: Self-pay | Admitting: *Deleted

## 2013-10-24 IMAGING — CR DG CHEST 2V
2 series · 2 of 2 positions shown · non-contrast
Comparison: None

CLINICAL DATA: Palpitations and fatigue.

CHEST - 2 VIEW

[w chest pa]
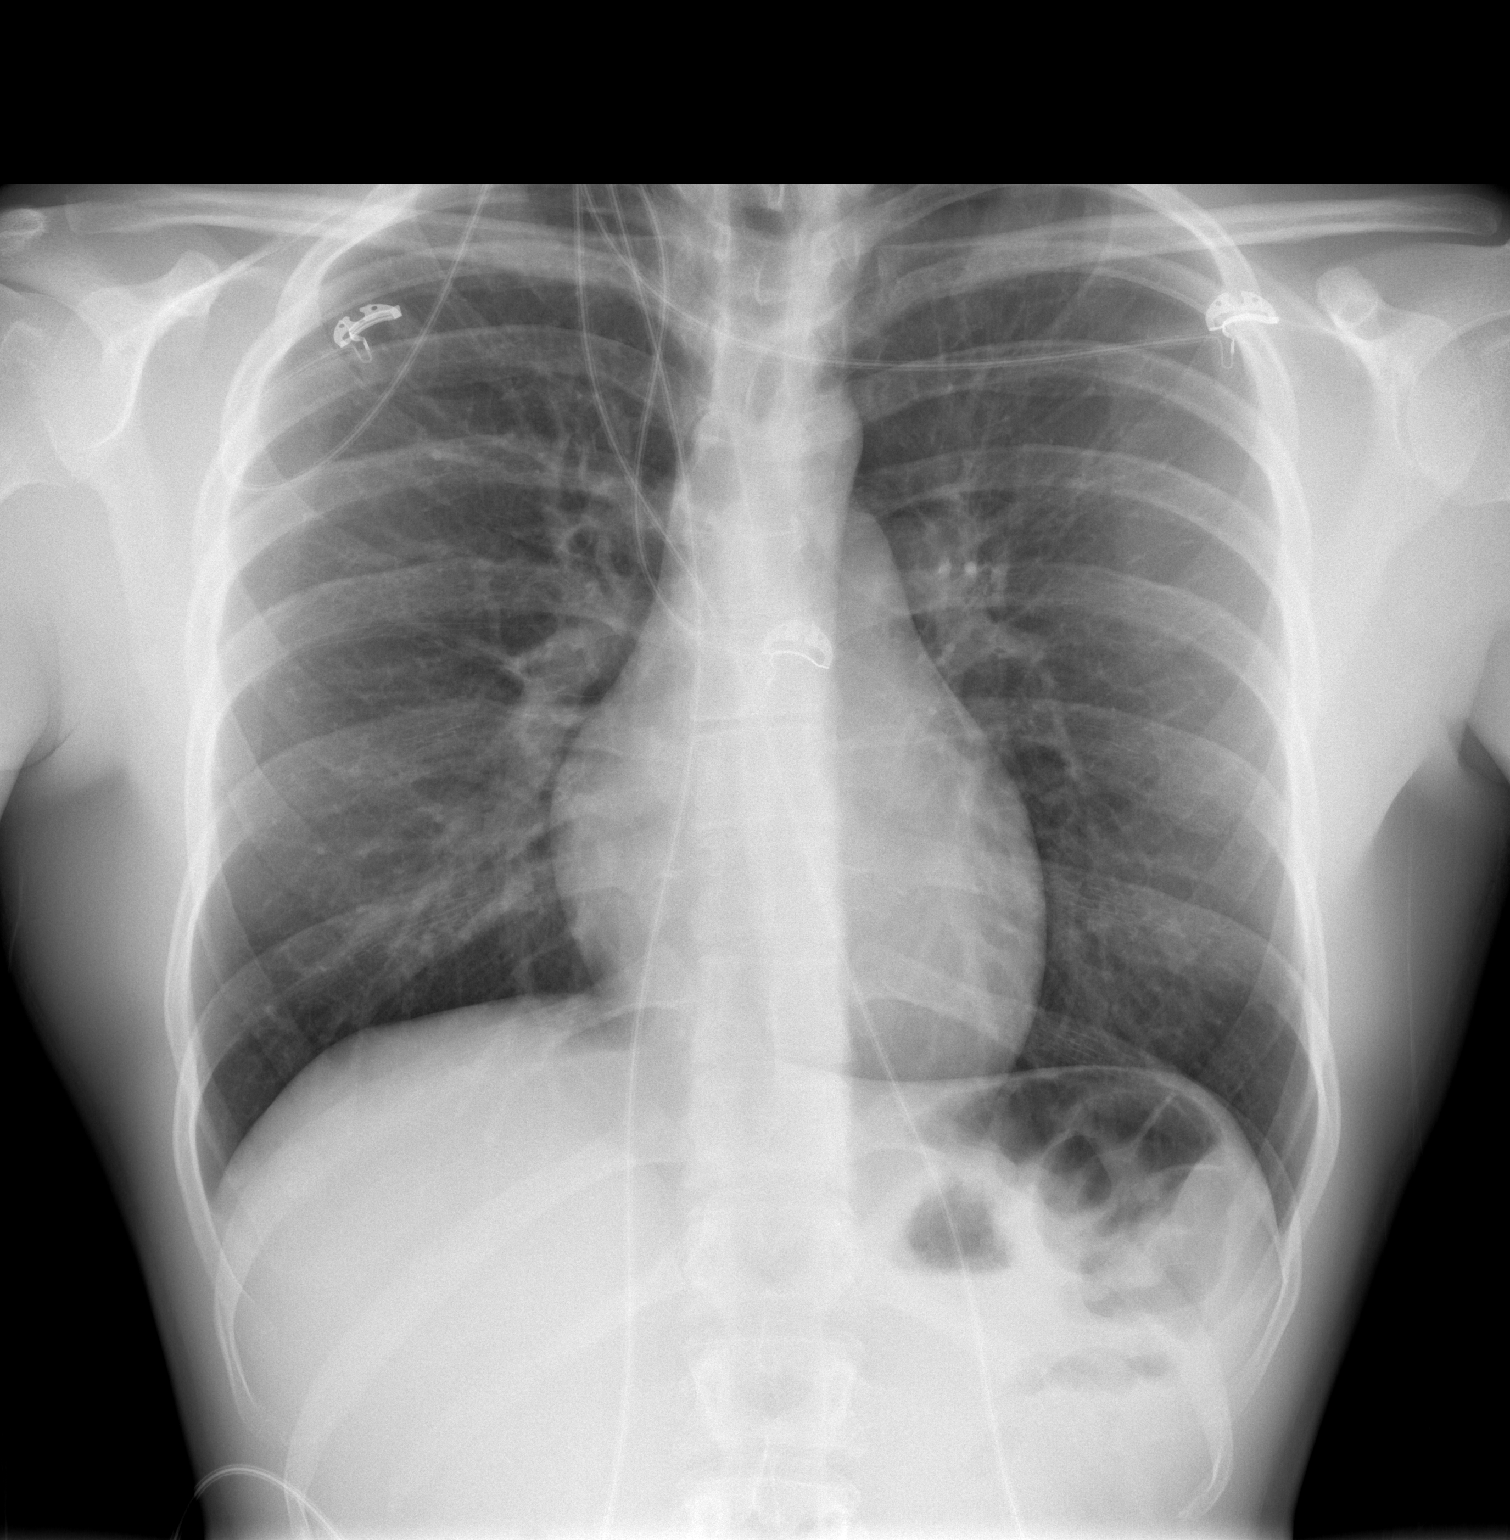

[w chest lat]
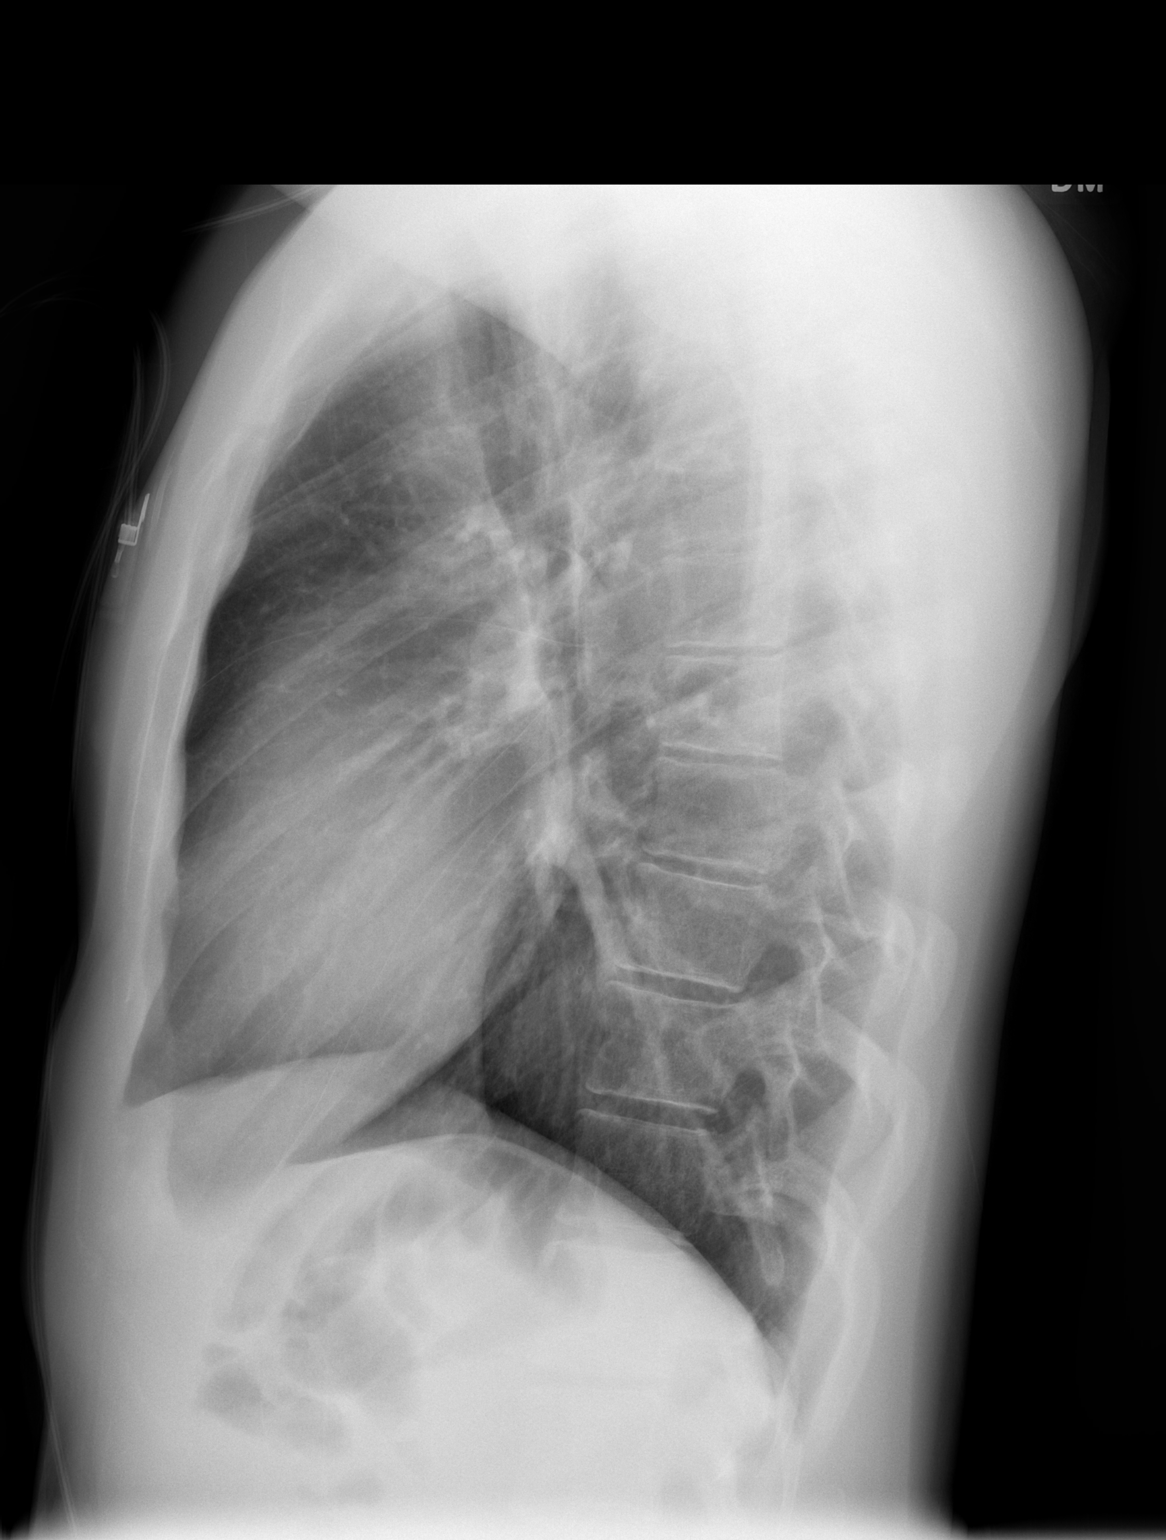

[2 of 2 positions shown; findings below may reference images not displayed]

FINDINGS: The heart size and mediastinal contours are within normal
limits.  Both lungs are clear.  The visualized skeletal structures
are unremarkable.
IMPRESSION: No active disease.

## 2013-11-09 ENCOUNTER — Encounter (HOSPITAL_COMMUNITY): Payer: Self-pay | Admitting: Emergency Medicine

## 2013-11-09 ENCOUNTER — Emergency Department (HOSPITAL_COMMUNITY)
Admission: EM | Admit: 2013-11-09 | Discharge: 2013-11-09 | Disposition: A | Discharge status: Home / Self Care | Payer: BC Managed Care – PPO | Attending: Emergency Medicine | Admitting: Emergency Medicine

## 2013-11-09 DIAGNOSIS — R519 Headache, unspecified: Secondary | ICD-10-CM

## 2013-11-09 DIAGNOSIS — R51 Headache: Secondary | ICD-10-CM | POA: Insufficient documentation

## 2013-11-09 DIAGNOSIS — Z8679 Personal history of other diseases of the circulatory system: Secondary | ICD-10-CM | POA: Insufficient documentation

## 2013-11-09 DIAGNOSIS — H409 Unspecified glaucoma: Secondary | ICD-10-CM | POA: Insufficient documentation

## 2013-11-09 DIAGNOSIS — G43909 Migraine, unspecified, not intractable, without status migrainosus: Secondary | ICD-10-CM

## 2013-11-09 MED ORDER — PROCHLORPERAZINE EDISYLATE 5 MG/ML IJ SOLN
10.0000 mg | Freq: Once | INTRAMUSCULAR | Status: AC
  Administered 2013-11-09: 10 mg via INTRAVENOUS
  Filled 2013-11-09: qty 2

## 2013-11-09 MED ORDER — PROCHLORPERAZINE EDISYLATE 5 MG/ML IJ SOLN: 10.0000 mg | Freq: Four times a day (QID) | INTRAMUSCULAR | Status: DC | PRN

## 2013-11-09 MED ORDER — SODIUM CHLORIDE 0.9 % IV BOLUS (SEPSIS)
1000.0000 mL | Freq: Once | INTRAVENOUS | Status: AC
  Administered 2013-11-09: 1000 mL via INTRAVENOUS

## 2013-11-09 MED ORDER — KETOROLAC TROMETHAMINE 60 MG/2ML IM SOLN
60.0000 mg | Freq: Once | INTRAMUSCULAR | Status: AC
  Administered 2013-11-09: 60 mg via INTRAMUSCULAR
  Filled 2013-11-09: qty 2

## 2013-11-09 MED ORDER — KETOROLAC TROMETHAMINE 30 MG/ML IJ SOLN: 30.0000 mg | Freq: Once | INTRAMUSCULAR | Status: DC

## 2013-11-09 MED ORDER — DIPHENHYDRAMINE HCL 50 MG/ML IJ SOLN
12.5000 mg | Freq: Once | INTRAMUSCULAR | Status: AC
  Administered 2013-11-09: 12.5 mg via INTRAVENOUS
  Filled 2013-11-09: qty 1

## 2013-11-09 MED ORDER — DEXAMETHASONE SODIUM PHOSPHATE 10 MG/ML IJ SOLN
10.0000 mg | Freq: Once | INTRAMUSCULAR | Status: AC
  Administered 2013-11-09: 10 mg via INTRAMUSCULAR
  Filled 2013-11-09: qty 1

## 2013-11-09 NOTE — ED Notes (Signed)
Bed: WA17 Expected date:  Expected time:  Means of arrival:  Comments: EMS/22 yo male with headache

## 2013-11-09 NOTE — ED Notes (Signed)
Pt a+ox4, presents from home with c/o L sided frontal HA, reports was seen here in ED this AM and given benadryl IV "it helped a little bit, but it's back now".  5/10 pain at this time.  Pt denies dizziness, weakness, vision changes, n/v.  Pt is well appearing.  Neuros grossly intact.  Skin pwd.

## 2013-11-09 NOTE — ED Notes (Signed)
MD at bedside at this time.

## 2013-11-09 NOTE — ED Provider Notes (Signed)
CSN: [id number]     Arrival date & time 11/09/13  1434 History  This chart was scribed for non-physician practitioner, Rhea Bleacher, PA-C working with Audree Camel, MD by Greggory Stallion, ED scribe. This patient was seen in room WTR9/WTR9 and the patient's care was started at 3:41 PM.   Chief Complaint  Patient presents with  . Headache   The history is provided by the patient. No language interpreter was used.   HPI Comments: Joseph Lin is a 23 y.o. male who presents to the Emergency Department complaining of gradual onset, constant left frontal, sharp headache that started at 8 PM yesterday. Denies hitting his head. Pt has about one headache per week and states they normally go away with Excedrin but it has provided no relief. He was seen in the ED earlier this morning for the same and given IV benadryl that helped some but states the headache has returned -- although at time of exam patient states pain is now gone after napping for a few minutes. He does state he is anxious that it will return. Denies fever, dental pain, congestion, sinus pressure, sore throat, visual disturbance, nausea, emesis, neck pain, upper or lower extremity weakness. Pt is not currently on preventative medications for his headaches. Denies family history of bleeding in the brain.   Past Medical History  Diagnosis Date  . Glaucoma   . Migraine   . Heart palpitations    Past Surgical History  Procedure Laterality Date  . Elbow fracture surgery    . Head surgery      Laceration   Family History  Problem Relation Age of Onset  . Migraines Mother   . Prostate cancer Father   . Migraines Father   . Diabetes Sister    History  Substance Use Topics  . Smoking status: Never Smoker   . Smokeless tobacco: Never Used  . Alcohol Use: No    Review of Systems  Constitutional: Negative for fever.  HENT: Negative for congestion, dental problem, rhinorrhea, sinus pressure and sore throat.   Eyes: Negative for  photophobia, discharge, redness and visual disturbance.  Respiratory: Negative for shortness of breath.   Cardiovascular: Negative for chest pain.  Gastrointestinal: Negative for nausea and vomiting.  Musculoskeletal: Negative for gait problem, neck pain and neck stiffness.  Skin: Negative for rash.  Neurological: Positive for headaches. Negative for syncope, speech difficulty, weakness, light-headedness and numbness.  Psychiatric/Behavioral: Negative for confusion.    Allergies  Review of patient's allergies indicates no known allergies.  Home Medications   Current Outpatient Rx  Name  Route  Sig  Dispense  Refill  . aspirin-acetaminophen-caffeine (EXCEDRIN MIGRAINE) 250-250-65 MG per tablet   Oral   Take 1 tablet by mouth every 6 (six) hours as needed for pain.         Marland Kitchen latanoprost (XALATAN) 0.005 % ophthalmic solution   Both Eyes   Place 1 drop into both eyes at bedtime.          BP 126/72  Pulse 71  Temp(Src) 97.9 F (36.6 C) (Oral)  Resp 20  SpO2 99%  Physical Exam  Nursing note and vitals reviewed. Constitutional: He is oriented to person, place, and time. He appears well-developed and well-nourished. No distress.  HENT:  Head: Normocephalic and atraumatic.  Right Ear: Tympanic membrane, external ear and ear canal normal.  Left Ear: Tympanic membrane, external ear and ear canal normal.  Nose: Nose normal.  Mouth/Throat: Uvula is midline, oropharynx is clear  and moist and mucous membranes are normal.  Eyes: Conjunctivae, EOM and lids are normal. Pupils are equal, round, and reactive to light.  Neck: Normal range of motion. Neck supple. No tracheal deviation present.  No meningismus.   Cardiovascular: Normal rate and regular rhythm.   Pulmonary/Chest: Effort normal and breath sounds normal. No respiratory distress.  Abdominal: Soft. There is no tenderness.  Musculoskeletal: Normal range of motion.       Cervical back: He exhibits normal range of motion, no  tenderness and no bony tenderness.  Neurological: He is alert and oriented to person, place, and time. He has normal strength and normal reflexes. No cranial nerve deficit or sensory deficit. He exhibits normal muscle tone. He displays a negative Romberg sign. Coordination and gait normal. GCS eye subscore is 4. GCS verbal subscore is 5. GCS motor subscore is 6.  Skin: Skin is warm and dry.  Psychiatric: He has a normal mood and affect. His behavior is normal.    ED Course  Procedures (including critical care time)  DIAGNOSTIC STUDIES: Oxygen Saturation is 99% on RA, normal by my interpretation.    COORDINATION OF CARE: 3:46 PM-Discussed treatment plan which includes Toradol/Decadron with pt at bedside and pt agreed to plan. Advised pt there is no need for a CT based on his physical exam and he agrees.   Labs Review Labs Reviewed - No data to display Imaging Review No results found.  EKG Interpretation   None      4:10 PM Patient seen and examined. I also spoke with Dr. Elesa MassedWard who saw patient earlier today. Medications ordered.   Vital signs reviewed and are as follows: Filed Vitals:   11/09/13 1457  BP: 126/72  Pulse: 71  Temp: 97.9 F (36.6 C)  Resp: 20   Encouraged PCP f/u for discussion of chronic HA.   Patient counseled to return if they have weakness in their arms or legs, slurred speech, trouble walking or talking, confusion, trouble with their balance, or if they have any other concerns. Patient verbalizes understanding and agrees with plan.      MDM   1. Headache      Patient with recurrent HA < 2 days, returned after ED visit this morning. I spoke with Dr. Elesa MassedWard regarding previous visit. After my independent exam, I do not feel there is any indication for imaging at this time. Symptoms are not changed from this AM per patient report. Patient is young and healthy. He did not hit his head. No thunderclap. His neurological exam is normal. No signs or indications of  meningitis. Patient is anxious about having a brain tumor. He appears well. Encouraged PCP f/u.   In fact, at time of exam, patient states symptoms are currently gone, but he is anxious that it will come back.   I personally performed the services described in this documentation, which was scribed in my presence. The recorded information has been reviewed and is accurate.   Joseph CriglerJoshua Tameah Mihalko, PA-C 11/09/13 [phone]

## 2013-11-09 NOTE — Discharge Instructions (Signed)
Migraine Headache A migraine headache is an intense, throbbing pain on one or both sides of your head. A migraine can last for 30 minutes to several hours. CAUSES  The exact cause of a migraine headache is not always known. However, a migraine may be caused when nerves in the brain become irritated and release chemicals that cause inflammation. This causes pain. Certain things may also trigger migraines, such as:  Alcohol.  Smoking.  Stress.  Menstruation.  Aged cheeses.  Foods or drinks that contain nitrates, glutamate, aspartame, or tyramine.  Lack of sleep.  Chocolate.  Caffeine.  Hunger.  Physical exertion.  Fatigue.  Medicines used to treat chest pain (nitroglycerine), birth control pills, estrogen, and some blood pressure medicines. SIGNS AND SYMPTOMS  Pain on one or both sides of your head.  Pulsating or throbbing pain.  Severe pain that prevents daily activities.  Pain that is aggravated by any physical activity.  Nausea, vomiting, or both.  Dizziness.  Pain with exposure to bright lights, loud noises, or activity.  General sensitivity to bright lights, loud noises, or smells. Before you get a migraine, you may get warning signs that a migraine is coming (aura). An aura may include:  Seeing flashing lights.  Seeing bright spots, halos, or zig-zag lines.  Having tunnel vision or blurred vision.  Having feelings of numbness or tingling.  Having trouble talking.  Having muscle weakness. DIAGNOSIS  A migraine headache is often diagnosed based on:  Symptoms.  Physical exam.  A CT scan or MRI of your head. These imaging tests cannot diagnose migraines, but they can help rule out other causes of headaches. TREATMENT Medicines may be given for pain and nausea. Medicines can also be given to help prevent recurrent migraines.  HOME CARE INSTRUCTIONS  Only take over-the-counter or prescription medicines for pain or discomfort as directed by your  health care provider. The use of long-term narcotics is not recommended.  Lie down in a dark, quiet room when you have a migraine.  Keep a journal to find out what may trigger your migraine headaches. For example, write down:  What you eat and drink.  How much sleep you get.  Any change to your diet or medicines.  Limit alcohol consumption.  Quit smoking if you smoke.  Get 7 9 hours of sleep, or as recommended by your health care provider.  Limit stress.  Keep lights dim if bright lights bother you and make your migraines worse. SEEK IMMEDIATE MEDICAL CARE IF:   Your migraine becomes severe.  You have a fever.  You have a stiff neck.  You have vision loss.  You have muscular weakness or loss of muscle control.  You start losing your balance or have trouble walking.  You feel faint or pass out.  You have severe symptoms that are different from your first symptoms. MAKE SURE YOU:   Understand these instructions.  Will watch your condition.  Will get help right away if you are not doing well or get worse. Document Released: 10/05/2005 Document Revised: 07/26/2013 Document Reviewed: 06/12/2013 [employer] Patient Information 2014 [employer].  

## 2013-11-09 NOTE — ED Notes (Signed)
Per EMS report: pt c/o of headache on his left temporal side that began at 8am. Pt took Excedrin this morning with no change in his pain.  Pt was able to go to work and sleep well last night. Pt denies blurred vision, dizziness, N/V, trauma. Pt ambulatory, a/o x 4.  EMS VS: BP: 118/72, HR: 84R, RR: 16, CBG: 85

## 2013-11-09 NOTE — Discharge Instructions (Signed)
Please read and follow all provided instructions.  Your diagnoses today include:  1. Headache     Tests performed today include:  Vital signs. See below for your results today.   Medications:  In the Emergency Department you received:  Toradol - NSAID medication similar to ibuprofen  Decadron - steroid medication that may prevent HA from coming back  Take any prescribed medications only as directed.  Additional information:  Follow any educational materials contained in this packet.  You are having a headache. No specific cause was found today for your headache. It may have been a migraine or other cause of headache. Stress, anxiety, fatigue, and depression are common triggers for headaches.   Your headache today does not appear to be life-threatening or require hospitalization, but often the exact cause of headaches is not determined in the emergency department. Therefore, follow-up with your doctor is very important to find out what may have caused your headache and whether or not you need any further diagnostic testing or treatment.   Sometimes headaches can appear benign (not harmful), but then more serious symptoms can develop which should prompt an immediate re-evaluation by your doctor or the emergency department.  BE VERY CAREFUL not to take multiple medicines containing Tylenol (also called acetaminophen). Doing so can lead to an overdose which can damage your liver and cause liver failure and possibly death.   Follow-up instructions: Please follow-up with your primary care provider in the next 3 days for further evaluation of your symptoms. If you do not have a primary care doctor -- see below for referral information.   Return instructions:   Please return to the Emergency Department if you experience worsening symptoms.  Return if the medications do not resolve your headache, if it recurs, or if you have multiple episodes of vomiting or cannot keep down  fluids.  Return if you have a change from the usual headache.  RETURN IMMEDIATELY IF you:  Develop a sudden, severe headache  Develop confusion or become poorly responsive or faint  Develop a fever above 100.47F or problem breathing  Have a change in speech, vision, swallowing, or understanding  Develop new weakness, numbness, tingling, incoordination in your arms or legs  Have a seizure  Please return if you have any other emergent concerns.  Additional Information:  Your vital signs today were: BP 126/72   Pulse 71   Temp(Src) 97.9 F (36.6 C) (Oral)   Resp 20   SpO2 99% If your blood pressure (BP) was elevated above 135/85 this visit, please have this repeated by your doctor within one month. --------------

## 2013-11-09 NOTE — ED Notes (Signed)
PT reports hx of headaches but this HA is different because it has not responded to Excedrin and is located in a different area. Pt denies blurred vision or nausea, no LOC or trauma.

## 2013-11-09 NOTE — ED Provider Notes (Signed)
TIME SEEN: 7:07 AM  CHIEF COMPLAINT: Headache  HPI: Patient is a 23 year old male with a history of migraine headaches, glaucoma, who presents the emergency department with gradual onset, left temporal throbbing that started 8am yesterday. He reports that he took Excedrin, ibuprofen and Tylenol that improved his pain but could not get his headache is completely resolved. He states this feels slightly different than his prior migraines because it is on the left side and his migraines are normal on the right. He denies any eye pain, blurry vision or vision loss, numbness or tingling, focal weakness, fever, neck pain or neck stiffness, head injury or anticoagulation use. No thunderclap headache.  States the pain as moderate in nature. No radiation. No aggravating or alleviating factors  ROS: See HPI Constitutional: no fever  Eyes: no drainage  ENT: no runny nose   Cardiovascular:  no chest pain  Resp: no SOB  GI: no vomiting GU: no dysuria Integumentary: no rash  Allergy: no hives  Musculoskeletal: no leg swelling  Neurological: no slurred speech ROS otherwise negative  PAST MEDICAL HISTORY/PAST SURGICAL HISTORY:  Past Medical History  Diagnosis Date  . Glaucoma   . Migraine   . Heart palpitations     MEDICATIONS:  Prior to Admission medications   Medication Sig Start Date End Date Taking? Authorizing Provider  aspirin-acetaminophen-caffeine (EXCEDRIN MIGRAINE) [phone] MG per tablet Take 1 tablet by mouth every 6 (six) hours as needed for pain.    Historical Provider, MD  latanoprost (XALATAN) 0.005 % ophthalmic solution Place 1 drop into both eyes at bedtime.    Historical Provider, MD  PROAIR HFA 108 (90 BASE) MCG/ACT inhaler Inhale 108 puffs into the lungs as needed. 05/19/13   Historical Provider, MD    ALLERGIES:  No Known Allergies  SOCIAL HISTORY:  History  Substance Use Topics  . Smoking status: Never Smoker   . Smokeless tobacco: Never Used  . Alcohol Use: No     FAMILY HISTORY: Family History  Problem Relation Age of Onset  . Migraines Mother   . Prostate cancer Father   . Migraines Father   . Diabetes Sister     EXAM: BP 118/69  Pulse 60  Temp(Src) 97.4 F (36.3 C) (Oral)  Resp 17  Ht 5\' 9"  (1.753 m)  Wt 154 lb (69.854 kg)  BMI 22.73 kg/m2  SpO2 100% CONSTITUTIONAL: Alert and oriented and responds appropriately to questions. Well-appearing; well-nourished, no distress HEAD: Normocephalic EYES: Conjunctivae clear, PERRL ENT: normal nose; no rhinorrhea; moist mucous membranes; pharynx without lesions noted NECK: Supple, no meningismus, no LAD  CARD: RRR; S1 and S2 appreciated; no murmurs, no clicks, no rubs, no gallops RESP: Normal chest excursion without splinting or tachypnea; breath sounds clear and equal bilaterally; no wheezes, no rhonchi, no rales,  ABD/GI: Normal bowel sounds; non-distended; soft, non-tender, no rebound, no guarding BACK:  The back appears normal and is non-tender to palpation, there is no CVA tenderness EXT: Normal ROM in all joints; non-tender to palpation; no edema; normal capillary refill; no cyanosis    SKIN: Normal color for age and race; warm NEURO: Moves all extremities equally, strength 5/5 in all 4 extremities, sensation to light touch intact diffusely, cranial nerves II through XII intact, no dysmetria to finger to nose testing PSYCH: The patient's mood and manner are appropriate. Grooming and personal hygiene are appropriate.  MEDICAL DECISION MAKING: Patient here with migraine headache. He is neurologically intact. I am not concerned for intracranial hemorrhage, meningitis.  I  do not feel he needs any head imaging at this time. We'll give IV fluids, Compazine and Benadryl and reassess.  ED PROGRESS: He reports his headache is completely resolved. We will discharge home. Instructed to continue to use Excedrin and ibuprofen as needed for headaches. Given return precautions. Patient verbalizes  understanding and is comfortable with plan.     Layla MawKristen N Raylin Winer, DO 11/09/13 [phone]

## 2013-11-10 NOTE — ED Provider Notes (Signed)
Medical screening examination/treatment/procedure(s) were performed by non-physician practitioner and as supervising physician I was immediately available for consultation/collaboration.  EKG Interpretation   None         Jennene Downie T Valor Quaintance, MD 11/10/13 0725 

## 2013-11-11 ENCOUNTER — Emergency Department (HOSPITAL_BASED_OUTPATIENT_CLINIC_OR_DEPARTMENT_OTHER)
Admission: EM | Admit: 2013-11-11 | Discharge: 2013-11-11 | Disposition: A | Discharge status: Home / Self Care | Payer: BC Managed Care – PPO | Attending: Emergency Medicine | Admitting: Emergency Medicine

## 2013-11-11 ENCOUNTER — Encounter (HOSPITAL_BASED_OUTPATIENT_CLINIC_OR_DEPARTMENT_OTHER): Payer: Self-pay | Admitting: Emergency Medicine

## 2013-11-11 DIAGNOSIS — R5381 Other malaise: Secondary | ICD-10-CM | POA: Insufficient documentation

## 2013-11-11 DIAGNOSIS — Z79899 Other long term (current) drug therapy: Secondary | ICD-10-CM | POA: Insufficient documentation

## 2013-11-11 DIAGNOSIS — R5383 Other fatigue: Secondary | ICD-10-CM

## 2013-11-11 DIAGNOSIS — R51 Headache: Secondary | ICD-10-CM

## 2013-11-11 DIAGNOSIS — Z8669 Personal history of other diseases of the nervous system and sense organs: Secondary | ICD-10-CM | POA: Insufficient documentation

## 2013-11-11 DIAGNOSIS — Z9889 Other specified postprocedural states: Secondary | ICD-10-CM | POA: Insufficient documentation

## 2013-11-11 DIAGNOSIS — G43909 Migraine, unspecified, not intractable, without status migrainosus: Secondary | ICD-10-CM | POA: Insufficient documentation

## 2013-11-11 DIAGNOSIS — R519 Headache, unspecified: Secondary | ICD-10-CM

## 2013-11-11 MED ORDER — OXYCODONE HCL 5 MG PO TABS
5.0000 mg | ORAL_TABLET | Freq: Once | ORAL | Status: AC
  Administered 2013-11-11: 5 mg via ORAL

## 2013-11-11 NOTE — ED Provider Notes (Signed)
CSN: [id number]     Arrival date & time 11/11/13  1312 History   First MD Initiated Contact with Patient 11/11/13 1344     Chief Complaint  Patient presents with  . Headache   (Consider location/radiation/quality/duration/timing/severity/associated sxs/prior Treatment) Patient is a 23 y.o. male presenting with headaches. The history is provided by the patient.  Headache Pain location:  L temporal and frontal Quality:  Stabbing Radiates to:  Does not radiate Severity currently:  8/10 Severity at highest:  8/10 Onset quality:  Gradual Duration:  4 days Timing:  Constant Progression:  Worsening Chronicity:  New Similar to prior headaches: yes   Relieved by:  Nothing Ineffective treatments:  Prescription medications Associated symptoms: fatigue   Associated symptoms: no abdominal pain, no back pain, no blurred vision, no congestion, no cough, no diarrhea, no dizziness, no drainage, no ear pain, no pain, no facial pain, no fever, no focal weakness, no hearing loss, no loss of balance, no myalgias, no nausea, no near-syncope, no neck pain, no neck stiffness, no numbness, no photophobia, no seizures, no syncope, no URI, no visual change and no vomiting    Joseph Lin is a 23 y.o. male who presents to the ED with a headache that started 3 days ago. He went to [facility] ED 3 days ago and was treated with IV benadryl, decadron and medication for nausea. He continued to have a headache. He went home and rested but the headache continued.  [facility] yesterday and was treated with steroid Rx. Today he reports the headache in the left temporal area that radiates to the left forehead. He rates the pain as 8/10. He has had migraines before and this feel similar but worse.   Past Medical History  Diagnosis Date  . Glaucoma   . Migraine   . Heart palpitations    Past Surgical History  Procedure Laterality Date  . Elbow fracture surgery    . Head surgery      Laceration    Family History  Problem Relation Age of Onset  . Migraines Mother   . Prostate cancer Father   . Migraines Father   . Diabetes Sister    History  Substance Use Topics  . Smoking status: Never Smoker   . Smokeless tobacco: Never Used  . Alcohol Use: No    Review of Systems  Constitutional: Positive for fatigue. Negative for fever.  HENT: Negative for congestion, ear pain, hearing loss and postnasal drip.   Eyes: Negative for blurred vision, photophobia, pain and visual disturbance.  Respiratory: Negative for cough.   Cardiovascular: Negative for syncope and near-syncope.  Gastrointestinal: Negative for nausea, vomiting, abdominal pain and diarrhea.  Genitourinary: Negative for dysuria, urgency and frequency.  Musculoskeletal: Negative for back pain, myalgias, neck pain and neck stiffness.  Skin: Negative for rash.  Neurological: Positive for headaches. Negative for dizziness, focal weakness, seizures, numbness and loss of balance.  Psychiatric/Behavioral: Negative for confusion. The patient is not nervous/anxious.     Allergies  Review of patient's allergies indicates no known allergies.  Home Medications   Current Outpatient Rx  Name  Route  Sig  Dispense  Refill  . aspirin-acetaminophen-caffeine (EXCEDRIN MIGRAINE) 250-250-65 MG per tablet   Oral   Take 1 tablet by mouth every 6 (six) hours as needed for pain.         Marland Kitchen. latanoprost (XALATAN) 0.005 % ophthalmic solution   Both Eyes   Place 1 drop into both eyes at  bedtime.          BP 127/73  Pulse 74  Temp(Src) 98.2 F (36.8 C) (Oral)  Resp 18  Ht 5\' 10"  (1.778 m)  Wt 160 lb (72.576 kg)  BMI 22.96 kg/m2  SpO2 100% Physical Exam  Nursing note and vitals reviewed. Constitutional: He is oriented to person, place, and time. He appears well-developed and well-nourished. No distress.  HENT:  Head: Normocephalic and atraumatic.    Right Ear: Tympanic membrane normal.  Left Ear: Tympanic membrane normal.   Nose: Nose normal.  Mouth/Throat: Uvula is midline, oropharynx is clear and moist and mucous membranes are normal.  Head ache this is not reproachable with palpation over the areas.    Eyes: Conjunctivae and EOM are normal. Pupils are equal, round, and reactive to light.  Neck: Normal range of motion. Neck supple.  Cardiovascular: Normal rate and regular rhythm.   Pulmonary/Chest: Effort normal. He has no wheezes. He has no rales.  Abdominal: Soft. Bowel sounds are normal. There is no tenderness.  Musculoskeletal: Normal range of motion.  Lymphadenopathy:    He has no cervical adenopathy.  Neurological: He is alert and oriented to person, place, and time. He has normal strength and normal reflexes. No cranial nerve deficit or sensory deficit. He displays a negative Romberg sign. Coordination and gait normal.  Skin: Skin is warm and dry.  Psychiatric: He has a normal mood and affect. His behavior is normal.    ED Course  Procedures  After oxycodone and dark room pain is 2/10. He is scheduled to follow up with his PCP on Monday.   MDM: I discussed this case with Dr. Bernette Mayers.   23 y.o. male with with left temporal and left frontal headache. Relieved with oxycodone. He will follow up with his PCP as scheduled on Monday. He is also scheduled for follow up with Neurology. Encouraged patient to follow up as scheduled. He agrees.     [facility] Orlene Och, [place] 11/11/13 1517

## 2013-11-11 NOTE — Discharge Instructions (Signed)
Follow up with your doctor as scheduled the first of the week. Rest, be sure you are drinking plenty of fluids. Return as needed.

## 2013-11-11 NOTE — ED Notes (Signed)
C/o onset of headache on Wed...Marland Kitchen.Marland Kitchen.Marland Kitchen.worsening since then.  C/o frontal headache. Taking Excedrin and Medrol dose pack (has been evaluated several times this week for this headache).  Reports has not had a CT scan.

## 2013-11-12 NOTE — ED Provider Notes (Signed)
Medical screening examination/treatment/procedure(s) were performed by non-physician practitioner and as supervising physician I was immediately available for consultation/collaboration.  EKG Interpretation   None         Charles B. Sheldon, MD 11/12/13 0752 

## 2014-01-04 ENCOUNTER — Encounter: Payer: Self-pay | Admitting: Neurology

## 2014-01-04 ENCOUNTER — Encounter (INDEPENDENT_AMBULATORY_CARE_PROVIDER_SITE_OTHER): Payer: Self-pay

## 2014-01-04 ENCOUNTER — Ambulatory Visit (INDEPENDENT_AMBULATORY_CARE_PROVIDER_SITE_OTHER): Payer: BC Managed Care – PPO | Admitting: Neurology

## 2014-01-04 VITALS — BP 110/65 | HR 63 | Ht 68.5 in | Wt 157.0 lb

## 2014-01-04 DIAGNOSIS — H409 Unspecified glaucoma: Secondary | ICD-10-CM

## 2014-01-04 DIAGNOSIS — G43009 Migraine without aura, not intractable, without status migrainosus: Secondary | ICD-10-CM

## 2014-01-04 MED ORDER — ELETRIPTAN HYDROBROMIDE 40 MG PO TABS: 40.0000 mg | ORAL_TABLET | ORAL | Status: AC | PRN

## 2014-01-04 NOTE — Patient Instructions (Signed)
Overall you are doing fairly well but I do want to suggest a few things today:   Remember to drink plenty of fluid, eat healthy meals and do not skip any meals. Try to eat protein with a every meal and eat a healthy snack such as fruit or nuts in between meals. Try to keep a regular sleep-wake schedule and try to exercise daily, particularly in the form of walking, 20-30 minutes a day, if you can.   As far as your medications are concerned, I would like to suggest you try Relpax 40mg . Please take one tablet when you feel the headache starting. If it is not better in 2 hours then take a 2nd tablet. No more then 2 tablets in 24 hours.   Follow up as needed. Please call us with any interim questions, concerns, problems, updates or refill requests.   My clinical assistant and will answer any of your questions and relay your messages to me and also relay most of my messages to you.   Our phone number is [phone]. We also have an after hours call service for urgent matters and there is a physician on-call for urgent questions. For any emergencies you know to call 911 or go to the nearest emergency room

## 2014-01-04 NOTE — Progress Notes (Signed)
[facility]  Provider:  Dr Hosie Poisson Referring Provider: No ref. provider found Primary Care Physician:  Lupe Carney, MD  CC:  headache  HPI:  Joseph Lin is a 23 y.o. male here as a follow up from the ED for headache evaluation. Reports having had a continuous headache for 1 week in January. Was his typical headache, mainly right sided, pulsating. No photo or phonophobia. Resolved on its own. Since last visit has seen eye doctor who reports no difficulty with glaucoma. Sleeping well. No other acute concerns at this time.   Initial visit 06/2013: Started having headaches a few months ago, getting 4 a week or so. Last hours to all day. Mainly right sided, throbbing, pulsating. No N/V. No photo and phonophobia. Had some numbness on R side of scalp. No vertigo. Some blurry vision with headache. No aura. Has history of migraines in the past, they were similar to this. Takes Excedrin and tylenol for the headache. Takes extra strength tylenol around 5 times a week. Goes in quiet dark room. At worst headaches are a 5/10. Feels headache frequency and severity have been decreasing week or so  Getting 7 to 8 sleep a night. Currently in school, notes having a lot of stress. Limited caffeine intake. No triggers. Random times throughout the days.   Strong family hx of migraines.   Has glaucoma, recently seen eye doctor, told everything is good but scheduled to see an eye specialist today.   Review of Systems: Out of a complete 14 system review, the patient complains of only the following symptoms, and all other reviewed systems are negative. Positive for headache  History   Social History  . Marital Status: Single    Spouse Name: N/A    Number of Children: 0  . Years of Education: college   Occupational History  . [employer]    Social History Main Topics  . Smoking status: Never Smoker   . Smokeless tobacco: Never Used  . Alcohol Use: No  . Drug Use: No  . Sexual Activity: Not on  file   Other Topics Concern  . Not on file   Social History Narrative   Lives with sister and nephew.    Right handed   Some college   occ soda     Family History  Problem Relation Age of Onset  . Migraines Mother   . Prostate cancer Father   . Migraines Father   . Diabetes Sister     Past Medical History  Diagnosis Date  . Glaucoma   . Migraine   . Heart palpitations     Past Surgical History  Procedure Laterality Date  . Elbow fracture surgery    . Head surgery      Laceration    Current Outpatient Prescriptions  Medication Sig Dispense Refill  . aspirin-acetaminophen-caffeine (EXCEDRIN MIGRAINE) 250-250-65 MG per tablet Take 1 tablet by mouth every 6 (six) hours as needed for pain.      Marland Kitchen latanoprost (XALATAN) 0.005 % ophthalmic solution Place 1 drop into both eyes at bedtime.      Marland Kitchen eletriptan (RELPAX) 40 MG tablet Take 1 tablet (40 mg total) by mouth as needed for migraine or headache. One tablet by mouth at onset of headache. May repeat in 2 hours if headache persists or recurs.  10 tablet  0   No current facility-administered medications for this visit.    Allergies as of 01/04/2014  . (No Known Allergies)    Vitals: BP 110/65  Pulse 63  Ht 5' 8.5" (1.74 m)  Wt 157 lb (71.215 kg)  BMI 23.52 kg/m2 Last Weight:  Wt Readings from Last 1 Encounters:  01/04/14 157 lb (71.215 kg)   Last Height:   Ht Readings from Last 1 Encounters:  01/04/14 5' 8.5" (1.74 m)     Physical exam: Exam: Gen: NAD, conversant Eyes: anicteric sclerae, moist conjunctivae HENT: Atraumati Neck: Trachea midline; supple,  Lungs: CTA, no wheezing, rales, rhonic                          CV: RRR, no MRG Abdomen: Soft, non-tender;  Extremities: No peripheral edema  Skin: Normal temperature, no rash,  Psych: Appropriate affect, pleasant  Neuro: MS: AA&Ox3, appropriately interactive, normal affect   Speech: fluent w/o paraphasic error  Memory: good recent and remote  recall  CN: PERRL, EOMI no nystagmus, funduscopic exam within normal limits, visual fields full to finger count bilaterally, no ptosis, sensation intact to LT V1-V3 bilat, face symmetric, no weakness, hearing grossly intact, palate elevates symmetrically, shoulder shrug 5/5 bilat,  tongue protrudes midline, no fasiculations noted.  Motor: normal bulk and tone Strength: 5/5  In all extremities  Coord: rapid alternating and point-to-point (FNF, HTS) movements intact.  Reflexes: symmetrical, bilat downgoing toes  Sens: LT intact in all extremities  Gait: posture, stance, stride and arm-swing normal. Tandem gait intact. Able to walk on heels and toes. Romberg absent.   Assessment:  After physical and neurologic examination, review of laboratory studies, imaging, neurophysiology testing and pre-existing records, assessment will be reviewed on the problem list.  Plan:  Treatment plan and additional workup will be reviewed under Problem List.  1)Migraine without aura 2)glaucoma  Joseph Lin is a pleasant 23 year old gentleman who presents for follow up evaluation of headache. He has a history of migraine headaches and has been overall stable with the exception of a week long episode in January. Has been evaluated by eye doctor with the patient reporting no change in glaucoma. Due to severe but infrequent nature of headaches will start patient on Relpax 40mg  prn. Patient instructed on proper use of medication. Follow up as needed. .Marland Kitchen

## 2014-01-30 ENCOUNTER — Encounter (HOSPITAL_BASED_OUTPATIENT_CLINIC_OR_DEPARTMENT_OTHER): Payer: Self-pay | Admitting: Emergency Medicine

## 2014-01-30 ENCOUNTER — Emergency Department (HOSPITAL_BASED_OUTPATIENT_CLINIC_OR_DEPARTMENT_OTHER)
Admission: EM | Admit: 2014-01-30 | Discharge: 2014-01-30 | Disposition: A | Discharge status: Home / Self Care | Payer: Worker's Compensation | Attending: Emergency Medicine | Admitting: Emergency Medicine

## 2014-01-30 ENCOUNTER — Emergency Department (HOSPITAL_BASED_OUTPATIENT_CLINIC_OR_DEPARTMENT_OTHER): Payer: Worker's Compensation

## 2014-01-30 DIAGNOSIS — Y9389 Activity, other specified: Secondary | ICD-10-CM | POA: Insufficient documentation

## 2014-01-30 DIAGNOSIS — X500XXA Overexertion from strenuous movement or load, initial encounter: Secondary | ICD-10-CM | POA: Insufficient documentation

## 2014-01-30 DIAGNOSIS — M549 Dorsalgia, unspecified: Secondary | ICD-10-CM

## 2014-01-30 DIAGNOSIS — Y990 Civilian activity done for income or pay: Secondary | ICD-10-CM | POA: Insufficient documentation

## 2014-01-30 DIAGNOSIS — Y929 Unspecified place or not applicable: Secondary | ICD-10-CM | POA: Insufficient documentation

## 2014-01-30 DIAGNOSIS — IMO0002 Reserved for concepts with insufficient information to code with codable children: Secondary | ICD-10-CM | POA: Insufficient documentation

## 2014-01-30 DIAGNOSIS — G43909 Migraine, unspecified, not intractable, without status migrainosus: Secondary | ICD-10-CM | POA: Insufficient documentation

## 2014-01-30 MED ORDER — IBUPROFEN 800 MG PO TABS: 800.0000 mg | ORAL_TABLET | Freq: Three times a day (TID) | ORAL | Status: AC

## 2014-01-30 MED ORDER — METHOCARBAMOL 500 MG PO TABS: 500.0000 mg | ORAL_TABLET | Freq: Two times a day (BID) | ORAL | Status: AC

## 2014-01-30 NOTE — ED Notes (Signed)
Pt ambulatory.

## 2014-01-30 NOTE — Discharge Instructions (Signed)
Back Pain, Adult Low back pain is very common. About 1 in 5 people have back pain.The cause of low back pain is rarely dangerous. The pain often gets better over time.About half of people with a sudden onset of back pain feel better in just 2 weeks. About 8 in 10 people feel better by 6 weeks.  CAUSES Some common causes of back pain include:  Strain of the muscles or ligaments supporting the spine.  Wear and tear (degeneration) of the spinal discs.  Arthritis.  Direct injury to the back. DIAGNOSIS Most of the time, the direct cause of low back pain is not known.However, back pain can be treated effectively even when the exact cause of the pain is unknown.Answering your caregiver's questions about your overall health and symptoms is one of the most accurate ways to make sure the cause of your pain is not dangerous. If your caregiver needs more information, he or she may order lab work or imaging tests (X-rays or MRIs).However, even if imaging tests show changes in your back, this usually does not require surgery. HOME CARE INSTRUCTIONS For many people, back pain returns.Since low back pain is rarely dangerous, it is often a condition that people can learn to manageon their own.   Remain active. It is stressful on the back to sit or stand in one place. Do not sit, drive, or stand in one place for more than 30 minutes at a time. Take short walks on level surfaces as soon as pain allows.Try to increase the length of time you walk each day.  Do not stay in bed.Resting more than 1 or 2 days can delay your recovery.  Do not avoid exercise or work.Your body is made to move.It is not dangerous to be active, even though your back may hurt.Your back will likely heal faster if you return to being active before your pain is gone.  Pay attention to your body when you bend and lift. Many people have less discomfortwhen lifting if they bend their knees, keep the load close to their bodies,and  avoid twisting. Often, the most comfortable positions are those that put less stress on your recovering back.  Find a comfortable position to sleep. Use a firm mattress and lie on your side with your knees slightly bent. If you lie on your back, put a pillow under your knees.  Only take over-the-counter or prescription medicines as directed by your caregiver. Over-the-counter medicines to reduce pain and inflammation are often the most helpful.Your caregiver may prescribe muscle relaxant drugs.These medicines help dull your pain so you can more quickly return to your normal activities and healthy exercise.  Put ice on the injured area.  Put ice in a plastic bag.  Place a towel between your skin and the bag.  Leave the ice on for 15-20 minutes, 03-04 times a day for the first 2 to 3 days. After that, ice and heat may be alternated to reduce pain and spasms.  Ask your caregiver about trying back exercises and gentle massage. This may be of some benefit.  Avoid feeling anxious or stressed.Stress increases muscle tension and can worsen back pain.It is important to recognize when you are anxious or stressed and learn ways to manage it.Exercise is a great option. SEEK MEDICAL CARE IF:  You have pain that is not relieved with rest or medicine.  You have pain that does not improve in 1 week.  You have new symptoms.  You are generally not feeling well. SEEK   IMMEDIATE MEDICAL CARE IF:   You have pain that radiates from your back into your legs.  You develop new bowel or bladder control problems.  You have unusual weakness or numbness in your arms or legs.  You develop nausea or vomiting.  You develop abdominal pain.  You feel faint. Document Released: 10/05/2005 Document Revised: 04/05/2012 Document Reviewed: 02/23/2011 [employer] Patient Information 2014 [employer].  

## 2014-01-30 NOTE — ED Provider Notes (Signed)
CSN: [id number]     Arrival date & time 01/30/14  1549 History   First MD Initiated Contact with Patient 01/30/14 1706     Chief Complaint  Patient presents with  . Back Pain     (Consider location/radiation/quality/duration/timing/severity/associated sxs/prior Treatment) Patient is a 23 y.o. male presenting with back pain. The history is provided by the patient. No language interpreter was used.  Back Pain Quality:  Aching Pain severity:  Moderate Pain is:  Same all the time Onset quality:  Sudden Timing:  Constant Progression:  Worsening Chronicity:  New Relieved by:  Nothing Worsened by:  Nothing tried Pt reports he picked up a package and had pain in his back.  Patin with taking a deep breath  Past Medical History  Diagnosis Date  . Glaucoma   . Migraine   . Heart palpitations    Past Surgical History  Procedure Laterality Date  . Elbow fracture surgery    . Head surgery      Laceration   Family History  Problem Relation Age of Onset  . Migraines Mother   . Prostate cancer Father   . Migraines Father   . Diabetes Sister    History  Substance Use Topics  . Smoking status: Never Smoker   . Smokeless tobacco: Never Used  . Alcohol Use: No    Review of Systems  Musculoskeletal: Positive for back pain.  All other systems reviewed and are negative.     Allergies  Review of patient's allergies indicates no known allergies.  Home Medications   Prior to Admission medications   Medication Sig Start Date End Date Taking? Authorizing Provider  aspirin-acetaminophen-caffeine (EXCEDRIN MIGRAINE) [phone] MG per tablet Take 1 tablet by mouth every 6 (six) hours as needed for pain.    Historical Provider, MD  eletriptan (RELPAX) 40 MG tablet Take 1 tablet (40 mg total) by mouth as needed for migraine or headache. One tablet by mouth at onset of headache. May repeat in 2 hours if headache persists or recurs. 01/04/14   Omelia BlackwaterPeter Justin Sumner, DO  latanoprost  (XALATAN) 0.005 % ophthalmic solution Place 1 drop into both eyes at bedtime.    Historical Provider, MD   BP 106/62  Pulse 56  Temp(Src) 98.6 F (37 C) (Oral)  Resp 18  Ht 5\' 8"  (1.727 m)  Wt 155 lb (70.308 kg)  BMI 23.57 kg/m2  SpO2 100% Physical Exam  Nursing note and vitals reviewed. Constitutional: He is oriented to person, place, and time. He appears well-developed and well-nourished.  HENT:  Head: Normocephalic and atraumatic.  Eyes: EOM are normal. Pupils are equal, round, and reactive to light.  Neck: Normal range of motion. Neck supple.  Cardiovascular: Normal rate and normal heart sounds.   Pulmonary/Chest: Effort normal and breath sounds normal.  Abdominal: Soft. He exhibits no distension.  Musculoskeletal:  Tender thoracic spine    Neurological: He is alert and oriented to person, place, and time.  Skin: Skin is warm.  Psychiatric: He has a normal mood and affect.    ED Course  Procedures (including critical care time) Labs Review Labs Reviewed - No data to display  Imaging Review Dg Chest 2 View  01/30/2014   CLINICAL DATA:  Back pain.  EXAM: CHEST  2 VIEW  COMPARISON:  Chest x-ray 04/09/2013.  FINDINGS: Lung volumes are normal. No consolidative airspace disease. No pleural effusions. No pneumothorax. No pulmonary nodule or mass noted. Pulmonary vasculature and the cardiomediastinal silhouette are within normal limits.  IMPRESSION: 1.  No radiographic evidence of acute cardiopulmonary disease.   Electronically Signed   By: Trudie Reedaniel  Entrikin M.D.   On: 01/30/2014 18:23     EKG Interpretation None      MDM   Final diagnoses:  Back pain    Ibuprofen and robaxin Follow up with    Elson AreasLeslie K Davionne Dowty, PA-C 01/30/14 1901

## 2014-01-30 NOTE — ED Notes (Signed)
Pt reports back injury at work while bending down to pick up package.  Denies need for workermans comp.

## 2014-01-31 NOTE — ED Provider Notes (Signed)
Medical screening examination/treatment/procedure(s) were performed by non-physician practitioner and as supervising physician I was immediately available for consultation/collaboration.   EKG Interpretation None        Shanna CiscoMegan E Docherty, MD 01/31/14 1121

## 2014-08-16 IMAGING — CR DG CHEST 2V
2 series · 2 of 2 positions shown · non-contrast
Comparison: Chest x-ray 04/09/2013.

CLINICAL DATA: Back pain.

EXAM:
CHEST  2 VIEW

[w chest pa]
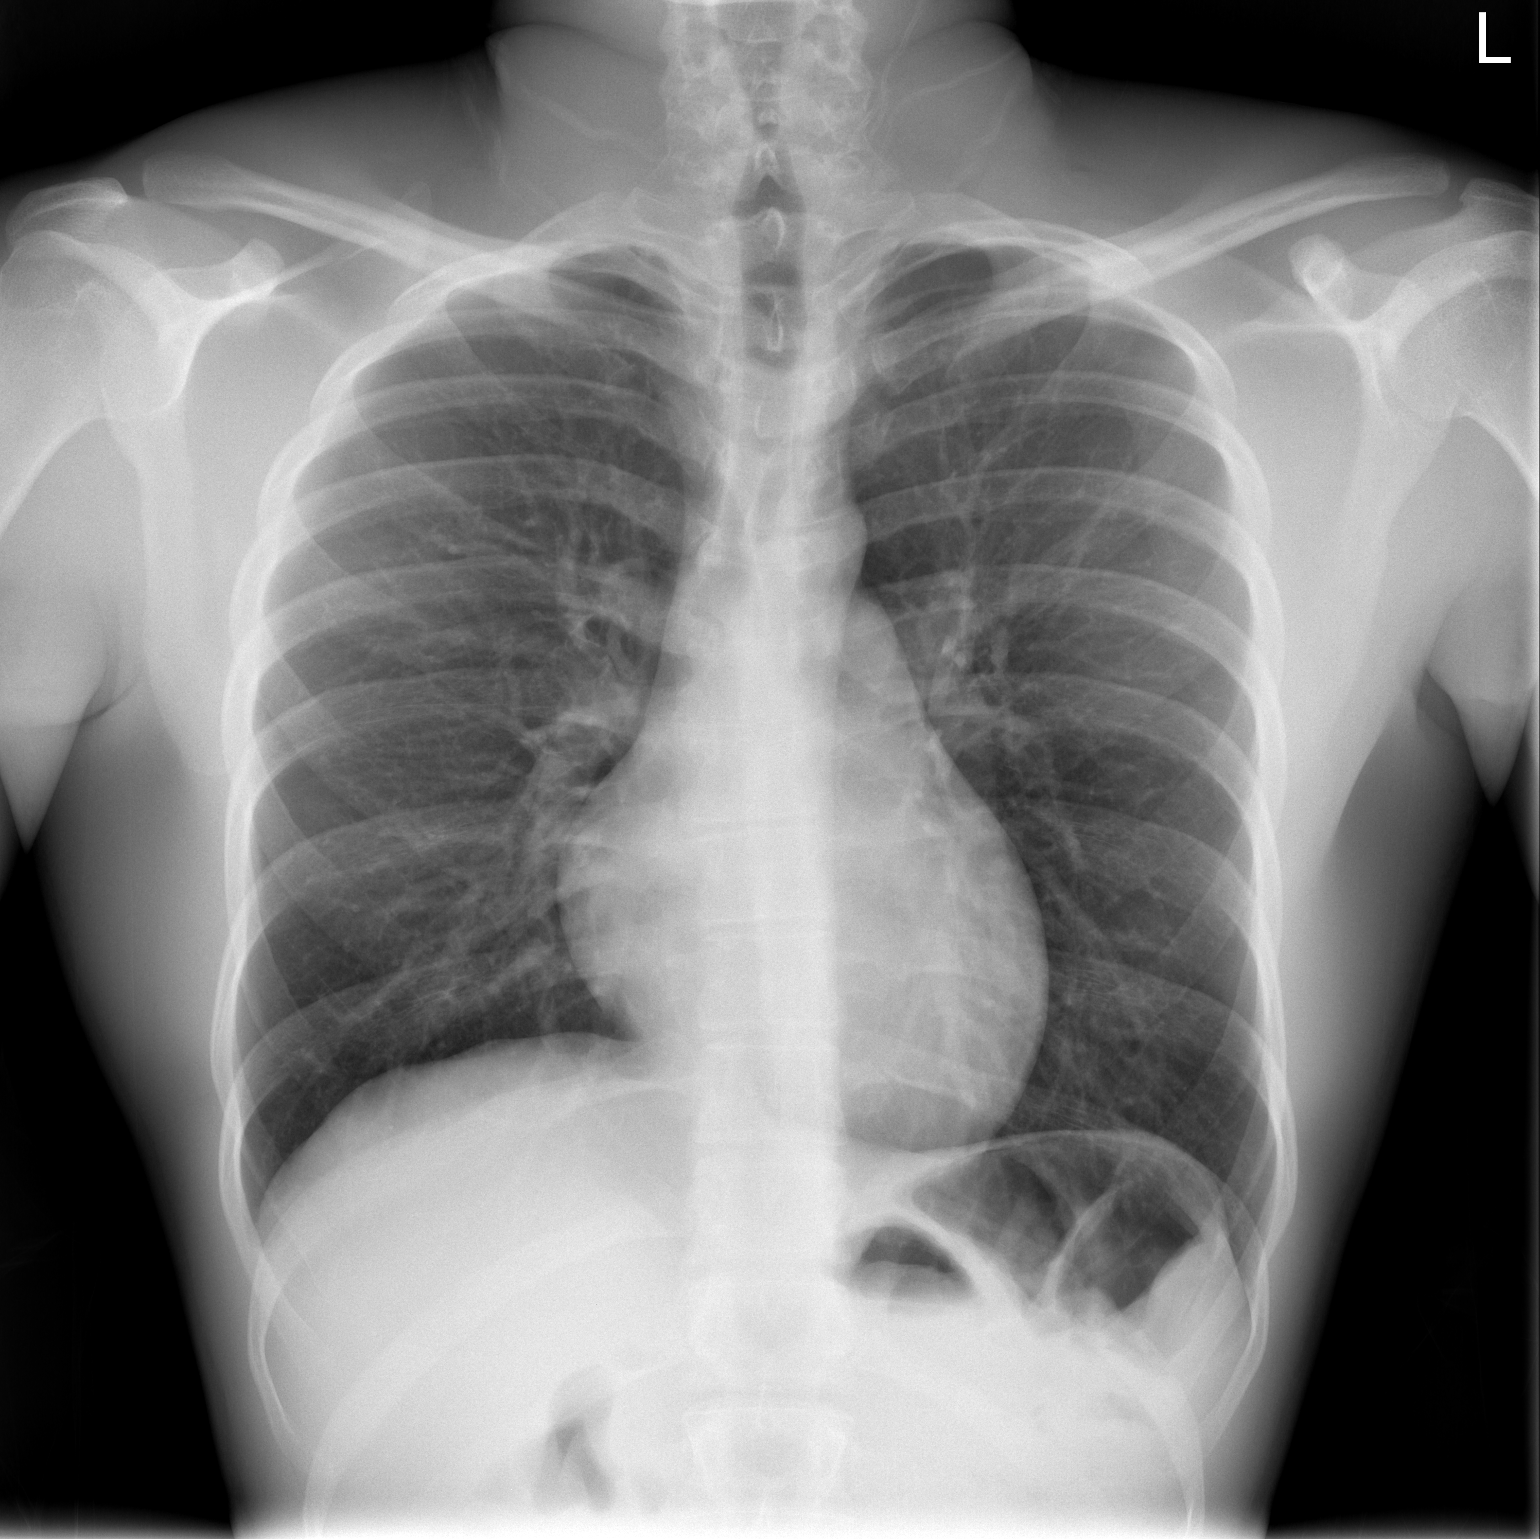

[w chest lat]
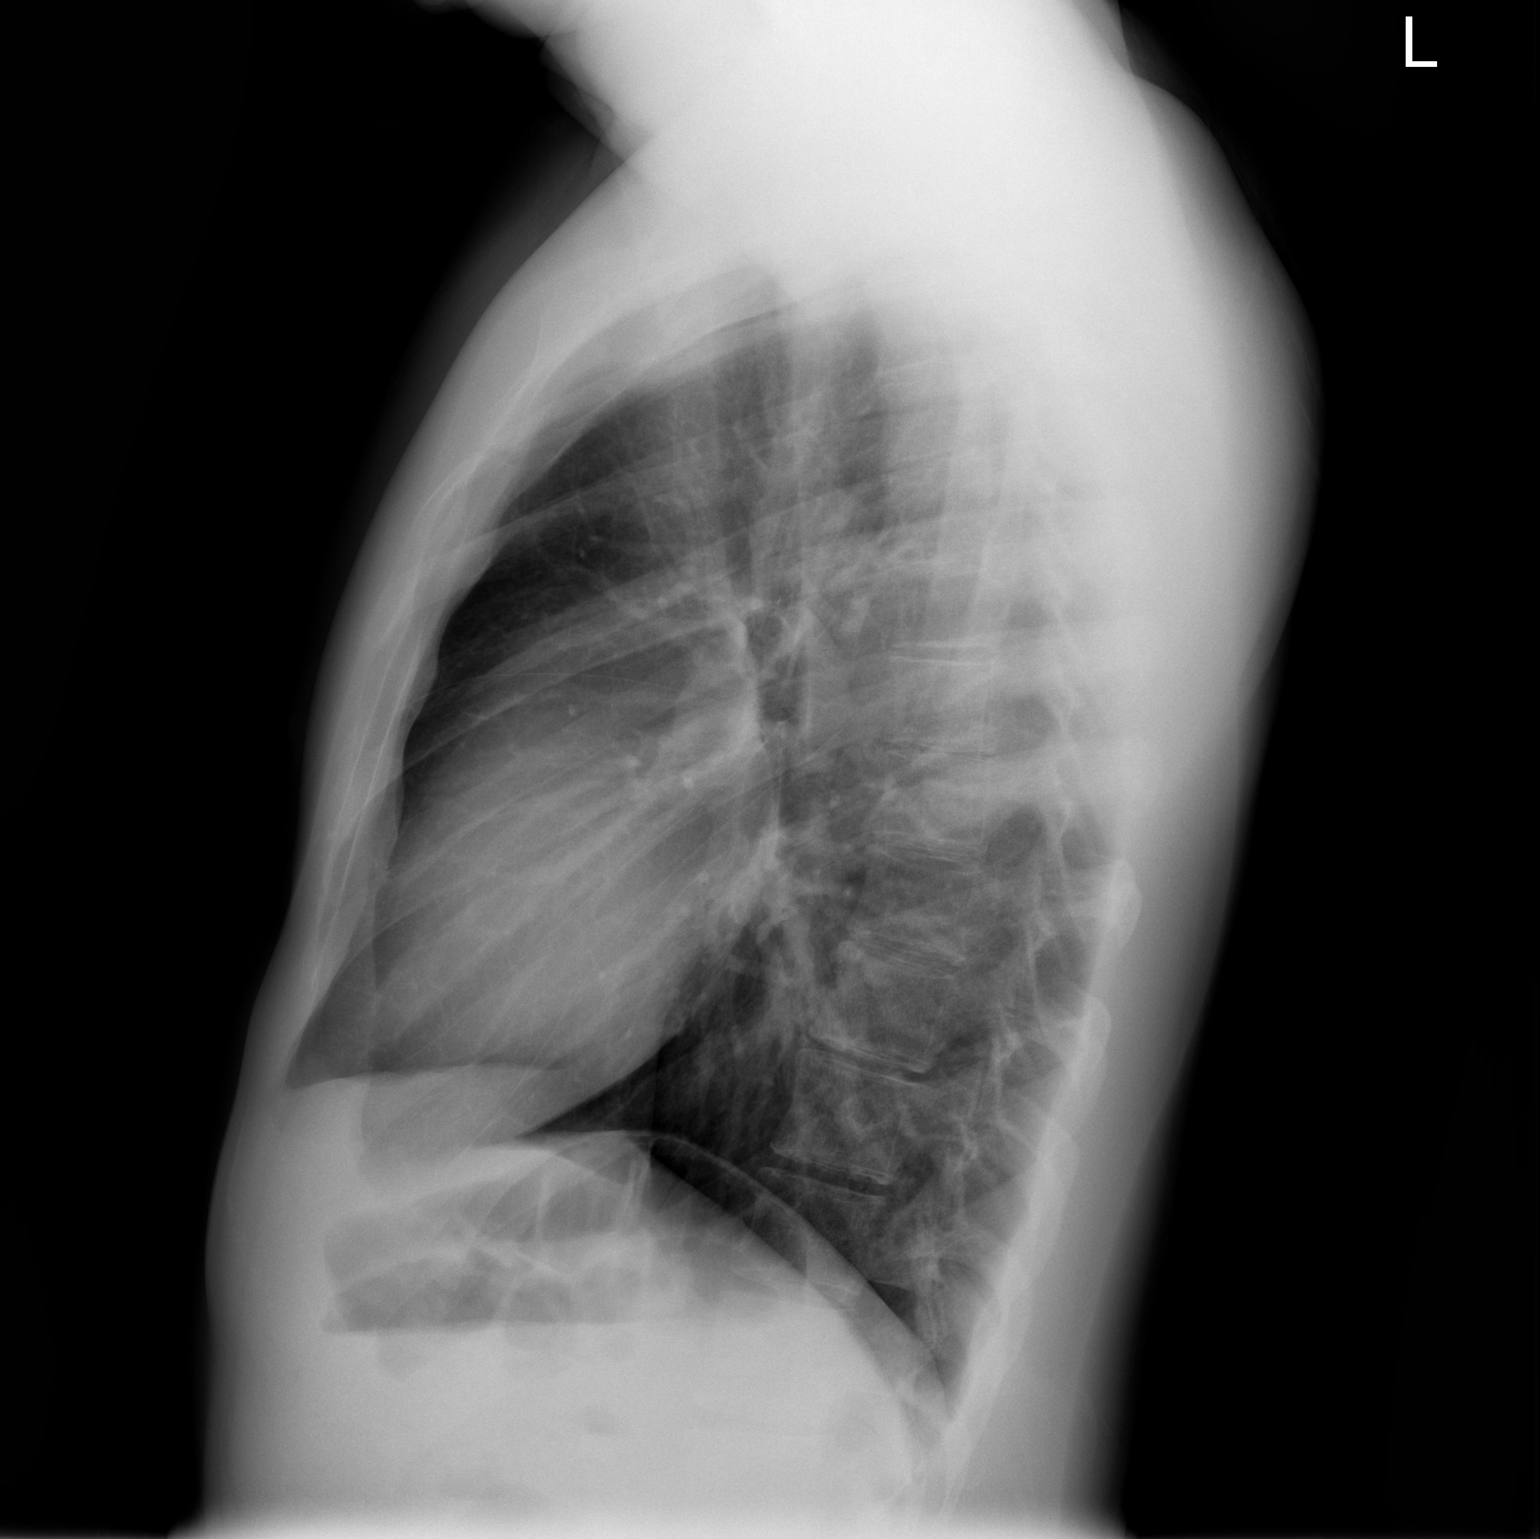

[2 of 2 positions shown; findings below may reference images not displayed]

FINDINGS: Lung volumes are normal. No consolidative airspace disease. No
pleural effusions. No pneumothorax. No pulmonary nodule or mass
noted. Pulmonary vasculature and the cardiomediastinal silhouette
are within normal limits.
IMPRESSION: 1.  No radiographic evidence of acute cardiopulmonary disease.
